# Patient Record
Sex: Female | Born: 1995 | Race: Black or African American | Hispanic: No | Marital: Single | Smoking: Current every day smoker
Health system: Southern US, Community
[De-identification: ages and names within clinical notes are randomized; demographics above are authoritative.]

## PROBLEM LIST (undated history)

## (undated) ENCOUNTER — Inpatient Hospital Stay: Payer: Self-pay

## (undated) DIAGNOSIS — F32A Depression, unspecified: Secondary | ICD-10-CM

## (undated) DIAGNOSIS — F4310 Post-traumatic stress disorder, unspecified: Secondary | ICD-10-CM

## (undated) DIAGNOSIS — F329 Major depressive disorder, single episode, unspecified: Secondary | ICD-10-CM

## (undated) HISTORY — PX: WISDOM TOOTH EXTRACTION: SHX21

---
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
## 2013-01-28 ENCOUNTER — Ambulatory Visit: Payer: Self-pay | Admitting: Primary Care

## 2013-04-02 ENCOUNTER — Emergency Department: Payer: Self-pay | Admitting: Internal Medicine

## 2013-10-30 ENCOUNTER — Emergency Department: Payer: Self-pay | Admitting: Internal Medicine

## 2013-10-30 LAB — URINALYSIS, COMPLETE
Bilirubin,UR: NEGATIVE
Glucose,UR: NEGATIVE mg/dL (ref 0–75)
Nitrite: NEGATIVE
PH: 6 (ref 4.5–8.0)
Protein: 100
RBC,UR: 284 /HPF (ref 0–5)
Specific Gravity: 1.025 (ref 1.003–1.030)
Squamous Epithelial: 29
WBC UR: 145 /HPF (ref 0–5)

## 2013-12-08 ENCOUNTER — Emergency Department: Payer: Self-pay | Admitting: Emergency Medicine

## 2013-12-08 LAB — COMPREHENSIVE METABOLIC PANEL
ALBUMIN: 4.2 g/dL (ref 3.4–5.0)
ANION GAP: 5 — AB (ref 7–16)
Alkaline Phosphatase: 78 U/L
BILIRUBIN TOTAL: 0.4 mg/dL (ref 0.2–1.0)
BUN: 16 mg/dL (ref 7–18)
CHLORIDE: 106 mmol/L (ref 98–107)
Calcium, Total: 9.4 mg/dL (ref 8.5–10.1)
Co2: 27 mmol/L (ref 21–32)
Creatinine: 0.83 mg/dL (ref 0.60–1.30)
Glucose: 79 mg/dL (ref 65–99)
Osmolality: 276 (ref 275–301)
Potassium: 3.6 mmol/L (ref 3.5–5.1)
SGOT(AST): 20 U/L (ref 15–37)
SGPT (ALT): 26 U/L (ref 12–78)
Sodium: 138 mmol/L (ref 136–145)
TOTAL PROTEIN: 8.3 g/dL — AB (ref 6.4–8.2)

## 2013-12-08 LAB — CBC WITH DIFFERENTIAL/PLATELET
BASOS PCT: 0.7 %
Basophil #: 0 10*3/uL (ref 0.0–0.1)
EOS ABS: 0 10*3/uL (ref 0.0–0.7)
Eosinophil %: 0.6 %
HCT: 42.3 % (ref 35.0–47.0)
HGB: 14 g/dL (ref 12.0–16.0)
LYMPHS ABS: 2.3 10*3/uL (ref 1.0–3.6)
Lymphocyte %: 52.9 %
MCH: 31.6 pg (ref 26.0–34.0)
MCHC: 33.1 g/dL (ref 32.0–36.0)
MCV: 95 fL (ref 80–100)
MONO ABS: 0.3 x10 3/mm (ref 0.2–0.9)
Monocyte %: 7.2 %
Neutrophil #: 1.7 10*3/uL (ref 1.4–6.5)
Neutrophil %: 38.6 %
Platelet: 144 10*3/uL — ABNORMAL LOW (ref 150–440)
RBC: 4.43 10*6/uL (ref 3.80–5.20)
RDW: 12.8 % (ref 11.5–14.5)
WBC: 4.3 10*3/uL (ref 3.6–11.0)

## 2013-12-08 LAB — URINALYSIS, COMPLETE
BILIRUBIN, UR: NEGATIVE
Bacteria: NONE SEEN
Blood: NEGATIVE
Glucose,UR: NEGATIVE mg/dL (ref 0–75)
Ketone: NEGATIVE
Leukocyte Esterase: NEGATIVE
NITRITE: NEGATIVE
PH: 6 (ref 4.5–8.0)
Protein: NEGATIVE
RBC,UR: <1 /HPF (ref 0–5)
Specific Gravity: 1.025 (ref 1.003–1.030)
Squamous Epithelial: 1
WBC UR: <1 /HPF (ref 0–5)

## 2013-12-08 LAB — LIPASE, BLOOD: LIPASE: 149 U/L (ref 73–393)

## 2013-12-08 LAB — TROPONIN I

## 2014-04-29 ENCOUNTER — Emergency Department: Payer: Self-pay | Admitting: Emergency Medicine

## 2014-04-29 LAB — URINALYSIS, COMPLETE
BLOOD: NEGATIVE
Bilirubin,UR: NEGATIVE
GLUCOSE, UR: NEGATIVE mg/dL (ref 0–75)
Ketone: NEGATIVE
LEUKOCYTE ESTERASE: NEGATIVE
NITRITE: NEGATIVE
PH: 6 (ref 4.5–8.0)
PROTEIN: NEGATIVE
RBC,UR: <1 /HPF (ref 0–5)
Specific Gravity: 1.017 (ref 1.003–1.030)
WBC UR: 1 /HPF (ref 0–5)

## 2014-07-30 ENCOUNTER — Emergency Department: Payer: Self-pay | Admitting: Emergency Medicine

## 2014-07-30 LAB — URINALYSIS, COMPLETE
BACTERIA: NONE SEEN
BILIRUBIN, UR: NEGATIVE
Blood: NEGATIVE
GLUCOSE, UR: NEGATIVE mg/dL (ref 0–75)
Leukocyte Esterase: NEGATIVE
NITRITE: NEGATIVE
PH: 6 (ref 4.5–8.0)
PROTEIN: NEGATIVE
RBC,UR: <1 /HPF (ref 0–5)
Specific Gravity: 1.02 (ref 1.003–1.030)
WBC UR: <1 /HPF (ref 0–5)

## 2014-07-30 LAB — COMPREHENSIVE METABOLIC PANEL
ALT: 27 U/L
ANION GAP: 8 (ref 7–16)
Albumin: 4.4 g/dL (ref 3.4–5.0)
Alkaline Phosphatase: 77 U/L
BILIRUBIN TOTAL: 0.9 mg/dL (ref 0.2–1.0)
BUN: 12 mg/dL (ref 7–18)
CO2: 25 mmol/L (ref 21–32)
Calcium, Total: 9.4 mg/dL (ref 8.5–10.1)
Chloride: 106 mmol/L (ref 98–107)
Creatinine: 0.71 mg/dL (ref 0.60–1.30)
EGFR (Non-African Amer.): >60
Glucose: 77 mg/dL (ref 65–99)
Osmolality: 276 (ref 275–301)
Potassium: 3.9 mmol/L (ref 3.5–5.1)
SGOT(AST): 31 U/L (ref 15–37)
SODIUM: 139 mmol/L (ref 136–145)
Total Protein: 8.6 g/dL — ABNORMAL HIGH (ref 6.4–8.2)

## 2014-07-30 LAB — CBC WITH DIFFERENTIAL/PLATELET
Eosinophil: 1 %
HCT: 43.2 % (ref 35.0–47.0)
HGB: 14.4 g/dL (ref 12.0–16.0)
Lymphocytes: 60 %
MCH: 31.8 pg (ref 26.0–34.0)
MCHC: 33.3 g/dL (ref 32.0–36.0)
MCV: 95 fL (ref 80–100)
MONOS PCT: 11 %
Platelet: 170 10*3/uL (ref 150–440)
RBC: 4.52 10*6/uL (ref 3.80–5.20)
RDW: 12.4 % (ref 11.5–14.5)
Segmented Neutrophils: 25 %
Variant Lymphocyte - H1-Rlymph: 3 %
WBC: 4.8 10*3/uL (ref 3.6–11.0)

## 2014-07-30 LAB — WET PREP, GENITAL

## 2014-07-31 LAB — GC/CHLAMYDIA PROBE AMP

## 2014-08-05 ENCOUNTER — Emergency Department: Payer: Self-pay | Admitting: Emergency Medicine

## 2014-08-05 LAB — CBC WITH DIFFERENTIAL/PLATELET
Basophil #: 0 10*3/uL (ref 0.0–0.1)
Basophil %: 0.8 %
EOS ABS: 0.2 10*3/uL (ref 0.0–0.7)
EOS PCT: 5.6 %
HCT: 40.6 % (ref 35.0–47.0)
HGB: 13.4 g/dL (ref 12.0–16.0)
LYMPHS ABS: 2.1 10*3/uL (ref 1.0–3.6)
LYMPHS PCT: 67.7 %
MCH: 31.8 pg (ref 26.0–34.0)
MCHC: 32.9 g/dL (ref 32.0–36.0)
MCV: 97 fL (ref 80–100)
MONO ABS: 0.3 x10 3/mm (ref 0.2–0.9)
MONOS PCT: 8.4 %
Neutrophil #: 0.5 10*3/uL — ABNORMAL LOW (ref 1.4–6.5)
Neutrophil %: 17.5 %
Platelet: 147 10*3/uL — ABNORMAL LOW (ref 150–440)
RBC: 4.2 10*6/uL (ref 3.80–5.20)
RDW: 12.6 % (ref 11.5–14.5)
WBC: 3.1 10*3/uL — ABNORMAL LOW (ref 3.6–11.0)

## 2014-08-05 LAB — URINALYSIS, COMPLETE
Bacteria: NONE SEEN
Bilirubin,UR: NEGATIVE
Blood: NEGATIVE
GLUCOSE, UR: NEGATIVE mg/dL (ref 0–75)
KETONE: NEGATIVE
Leukocyte Esterase: NEGATIVE
NITRITE: NEGATIVE
Ph: 6 (ref 4.5–8.0)
Protein: NEGATIVE
Specific Gravity: 1.02 (ref 1.003–1.030)
Squamous Epithelial: <1

## 2014-08-05 LAB — COMPREHENSIVE METABOLIC PANEL
ALK PHOS: 81 U/L
Albumin: 3.8 g/dL (ref 3.4–5.0)
Anion Gap: 5 — ABNORMAL LOW (ref 7–16)
BUN: 11 mg/dL (ref 7–18)
Bilirubin,Total: 0.5 mg/dL (ref 0.2–1.0)
Calcium, Total: 8.8 mg/dL (ref 8.5–10.1)
Chloride: 110 mmol/L — ABNORMAL HIGH (ref 98–107)
Co2: 25 mmol/L (ref 21–32)
Creatinine: 0.78 mg/dL (ref 0.60–1.30)
EGFR (African American): >60
EGFR (Non-African Amer.): >60
Glucose: 77 mg/dL (ref 65–99)
Osmolality: 278 (ref 275–301)
Potassium: 4.3 mmol/L (ref 3.5–5.1)
SGOT(AST): 26 U/L (ref 15–37)
SGPT (ALT): 20 U/L
SODIUM: 140 mmol/L (ref 136–145)
TOTAL PROTEIN: 7.6 g/dL (ref 6.4–8.2)

## 2014-08-05 LAB — LIPASE, BLOOD: Lipase: 143 U/L (ref 73–393)

## 2014-08-20 ENCOUNTER — Ambulatory Visit: Payer: Self-pay | Admitting: Primary Care

## 2014-12-22 ENCOUNTER — Emergency Department
Admission: EM | Admit: 2014-12-22 | Discharge: 2014-12-22 | Disposition: A | Discharge status: Home / Self Care | Payer: Self-pay | Attending: Emergency Medicine | Admitting: Emergency Medicine

## 2014-12-22 ENCOUNTER — Encounter: Payer: Self-pay | Admitting: Emergency Medicine

## 2014-12-22 DIAGNOSIS — Z3202 Encounter for pregnancy test, result negative: Secondary | ICD-10-CM | POA: Insufficient documentation

## 2014-12-22 DIAGNOSIS — R110 Nausea: Secondary | ICD-10-CM | POA: Insufficient documentation

## 2014-12-22 DIAGNOSIS — N760 Acute vaginitis: Secondary | ICD-10-CM | POA: Insufficient documentation

## 2014-12-22 DIAGNOSIS — Z720 Tobacco use: Secondary | ICD-10-CM | POA: Insufficient documentation

## 2014-12-22 LAB — CBC WITH DIFFERENTIAL/PLATELET
BASOS ABS: 0 10*3/uL (ref 0–0.1)
Basophils Relative: 1 %
EOS ABS: 0.1 10*3/uL (ref 0–0.7)
Eosinophils Relative: 1 %
HEMATOCRIT: 38.8 % (ref 35.0–47.0)
HEMOGLOBIN: 13.2 g/dL (ref 12.0–16.0)
Lymphocytes Relative: 48 %
Lymphs Abs: 2.1 10*3/uL (ref 1.0–3.6)
MCH: 32.6 pg (ref 26.0–34.0)
MCHC: 34 g/dL (ref 32.0–36.0)
MCV: 95.8 fL (ref 80.0–100.0)
MONO ABS: 0.3 10*3/uL (ref 0.2–0.9)
MONOS PCT: 7 %
NEUTROS PCT: 43 %
Neutro Abs: 1.8 10*3/uL (ref 1.4–6.5)
Platelets: 140 10*3/uL — ABNORMAL LOW (ref 150–440)
RBC: 4.05 MIL/uL (ref 3.80–5.20)
RDW: 12.6 % (ref 11.5–14.5)
WBC: 4.3 10*3/uL (ref 3.6–11.0)

## 2014-12-22 LAB — URINALYSIS COMPLETE WITH MICROSCOPIC (ARMC ONLY)
BILIRUBIN URINE: NEGATIVE
Glucose, UA: NEGATIVE mg/dL
KETONES UR: NEGATIVE mg/dL
Leukocytes, UA: NEGATIVE
Nitrite: NEGATIVE
PROTEIN: NEGATIVE mg/dL
Specific Gravity, Urine: 1.005 (ref 1.005–1.030)
pH: 7 (ref 5.0–8.0)

## 2014-12-22 LAB — CHLAMYDIA/NGC RT PCR (ARMC ONLY)
Chlamydia Tr: NOT DETECTED
N GONORRHOEAE: NOT DETECTED

## 2014-12-22 LAB — WET PREP, GENITAL
Trich, Wet Prep: NONE SEEN
Yeast Wet Prep HPF POC: NONE SEEN

## 2014-12-22 LAB — POCT PREGNANCY, URINE: PREG TEST UR: NEGATIVE

## 2014-12-22 MED ORDER — METRONIDAZOLE 500 MG PO TABS: 500.0000 mg | ORAL_TABLET | Freq: Two times a day (BID) | ORAL | Status: AC

## 2014-12-22 MED ORDER — IBUPROFEN 800 MG PO TABS: 800.0000 mg | ORAL_TABLET | Freq: Three times a day (TID) | ORAL | Status: DC | PRN

## 2014-12-22 NOTE — ED Notes (Signed)
States this morning woke up with body aches, states was unable to get out of bed, last week, episode of vaginal bleeding, lasted short time, light, then had white, clear discharge, continues with discharge and vaginal itching

## 2014-12-22 NOTE — Discharge Instructions (Signed)
Bacterial Vaginosis Bacterial vaginosis is a vaginal infection that occurs when the normal balance of bacteria in the vagina is disrupted. It results from an overgrowth of certain bacteria. This is the most common vaginal infection in women of childbearing age. Treatment is important to prevent complications, especially in pregnant women, as it can cause a premature delivery. CAUSES  Bacterial vaginosis is caused by an increase in harmful bacteria that are normally present in smaller amounts in the vagina. Several different kinds of bacteria can cause bacterial vaginosis. However, the reason that the condition develops is not fully understood. RISK FACTORS Certain activities or behaviors can put you at an increased risk of developing bacterial vaginosis, including:  Having a new sex partner or multiple sex partners.  Douching.  Using an intrauterine device (IUD) for contraception. Women do not get bacterial vaginosis from toilet seats, bedding, swimming pools, or contact with objects around them. SIGNS AND SYMPTOMS  Some women with bacterial vaginosis have no signs or symptoms. Common symptoms include:  Grey vaginal discharge.  A fishlike odor with discharge, especially after sexual intercourse.  Itching or burning of the vagina and vulva.  Burning or pain with urination. DIAGNOSIS  Your health care provider will take a medical history and examine the vagina for signs of bacterial vaginosis. A sample of vaginal fluid may be taken. Your health care provider will look at this sample under a microscope to check for bacteria and abnormal cells. A vaginal pH test may also be done.  TREATMENT  Bacterial vaginosis may be treated with antibiotic medicines. These may be given in the form of a pill or a vaginal cream. A second round of antibiotics may be prescribed if the condition comes back after treatment.  HOME CARE INSTRUCTIONS   Only take over-the-counter or prescription medicines as  directed by your health care provider.  If antibiotic medicine was prescribed, take it as directed. Make sure you finish it even if you start to feel better.  Do not have sex until treatment is completed.  Tell all sexual partners that you have a vaginal infection. They should see their health care provider and be treated if they have problems, such as a mild rash or itching.  Practice safe sex by using condoms and only having one sex partner. SEEK MEDICAL CARE IF:   Your symptoms are not improving after 3 days of treatment.  You have increased discharge or pain.  You have a fever. MAKE SURE YOU:   Understand these instructions.  Will watch your condition.  Will get help right away if you are not doing well or get worse. FOR MORE INFORMATION  Centers for Disease Control and Prevention, Division of STD Prevention: www.cdc.gov/std American Sexual Health Association (ASHA): www.ashastd.org  Document Released: 07/31/2005 Document Revised: 05/21/2013 Document Reviewed: 03/12/2013 Exit[employer] Patient Information 2015 [employer]. This information is not intended to replace advice given to you by your health care provider. Make sure you discuss any questions you have with your health care provider.  

## 2014-12-22 NOTE — ED Provider Notes (Signed)
[facility] Emergency Department Provider Note  ____________________________________________  Time seen: Approximately 1555  I have reviewed the triage vital signs and the nursing notes.   HISTORY  Chief Complaint Generalized Body Aches and Nausea    HPI Kimberly Powers is a 19 y.o. female complaining of a couple day history ofvaginal discharge and itching spotting since she woke up today felt a little tired and weak is concerned that she may be pregnant is here today for evaluation denies any pain has had some intermittent nausea nothing seems to making anything better or worse no other associated signs or symptoms except for an previously noted   History reviewed. No pertinent past medical history.  There are no active problems to display for this patient.   History reviewed. No pertinent past surgical history.  Current Outpatient Rx  Name  Route  Sig  Dispense  Refill  . ibuprofen (ADVIL,MOTRIN) 800 MG tablet   Oral   Take 1 tablet (800 mg total) by mouth every 8 (eight) hours as needed.   30 tablet   0   . metroNIDAZOLE (FLAGYL) 500 MG tablet   Oral   Take 1 tablet (500 mg total) by mouth 2 (two) times daily.   14 tablet   0     Allergies Review of patient's allergies indicates no known allergies.  No family history on file.  Social History History  Substance Use Topics  . Smoking status: Current Every Day Smoker  . Smokeless tobacco: Not on file  . Alcohol Use: Yes     Comment: beer/day    Review of Systems Constitutional: No fever/chills Eyes: No visual changes. ENT: No sore throat. Cardiovascular: Denies chest pain. Respiratory: Denies shortness of breath. Gastrointestinal: No abdominal pain.  No nausea, no vomiting.  No diarrhea.  No constipation. Genitourinary: Negative for dysuria. Musculoskeletal: Negative for back pain. Skin: Negative for rash. Neurological: Negative for headaches, focal weakness or  numbness.  6point ROS otherwise negative.  ____________________________________________   PHYSICAL EXAM:  VITAL SIGNS: ED Triage Vitals  Enc Vitals Group     BP 12/22/14 1448 118/76 mmHg     Pulse Rate 12/22/14 1448 74     Resp --      Temp 12/22/14 1448 98.2 F (36.8 C)     Temp Source 12/22/14 1448 Oral     SpO2 12/22/14 1448 100 %     Weight 12/22/14 1448 105 lb (47.628 kg)     Height 12/22/14 1448 5\' 3"  (1.6 m)     Head Cir --      Peak Flow --      Pain Score 12/22/14 1448 10     Pain Loc --      Pain Edu? --      Excl. in GC? --     Constitutional: Alert and oriented. Well appearing and in no acute distress. Eyes: Conjunctivae are normal. PERRL. EOMI. Head: Atraumatic. Cardiovascular: Normal rate, regular rhythm. Grossly normal heart sounds.  Good peripheral circulation. Respiratory: Normal respiratory effort.  No retractions. Lungs CTAB. Gastrointestinal: Soft and nontender. No distention. No abdominal bruits. No CVA tenderness. Genitourinary: External genitalia unremarkable bimanual exam revealed no cervical or adnexal tenderness patient has a grayish white vaginal discharge Muloskeletal: No lower extremity tenderness nor edema.  No joint effusions. Neurologic:  Normal speech and language. No gross focal neurologic deficits are appreciated. Speech is normal. No gait instability. Skin:  Skin is warm, dry and intact. No rash noted. Psychiatric: Mood  and affect are normal. Speech and behavior are normal.  ____________________________________________   LABS (all labs ordered are listed, but only abnormal results are displayed)  Labs Reviewed  WET PREP, GENITAL - Abnormal; Notable for the following:    Clue Cells Wet Prep HPF POC FEW (*)    WBC, Wet Prep HPF POC FEW (*)    All other components within normal limits  CBC WITH DIFFERENTIAL/PLATELET - Abnormal; Notable for the following:    Platelets 140 (*)    All other components within normal limits  URINALYSIS  COMPLETEWITH MICROSCOPIC ([facility])  - Abnormal; Notable for the following:    Color, Urine COLORLESS (*)    APPearance CLEAR (*)    Hgb urine dipstick 1+ (*)    Bacteria, UA RARE (*)    Squamous Epithelial / LPF 0-5 (*)    All other components within normal limits  CHLAMYDIA/NGC RT PCR ([facility])   POC URINE PREG, ED  POCT PREGNANCY, URINE   ____________________________________________   PROCEDURES  Procedure(s) performed: None  Critical Care performed: No  ____________________________________________   INITIAL IMPRESSION / ASSESSMENT AND PLAN / ED COURSE  Pertinent labs & imaging results that were available during my care of the patient were reviewed by me and considered in my medical decision making (see chart for details).  Bacterial vaginitis patient be discharged home prescriptions for Flagyl Motrin follow-up with OB/GYN as needed for any further concerns or return here for any acute concerns or worsening symptoms ____________________________________________   FINAL CLINICAL IMPRESSION(S) / ED DIAGNOSES  Final diagnoses:  Vaginitis      Jamicah Anstead Rosalyn GessWilliam C Izzak Fries, PA-C 12/22/14 1807  Sharyn CreamerMark Quale, MD 12/26/14 1657

## 2015-02-03 ENCOUNTER — Emergency Department
Admission: EM | Admit: 2015-02-03 | Discharge: 2015-02-03 | Disposition: A | Discharge status: Home / Self Care | Payer: Self-pay | Attending: Emergency Medicine | Admitting: Emergency Medicine

## 2015-02-03 DIAGNOSIS — Z720 Tobacco use: Secondary | ICD-10-CM | POA: Insufficient documentation

## 2015-02-03 DIAGNOSIS — N760 Acute vaginitis: Secondary | ICD-10-CM | POA: Insufficient documentation

## 2015-02-03 DIAGNOSIS — Z3202 Encounter for pregnancy test, result negative: Secondary | ICD-10-CM | POA: Insufficient documentation

## 2015-02-03 DIAGNOSIS — B9689 Other specified bacterial agents as the cause of diseases classified elsewhere: Secondary | ICD-10-CM

## 2015-02-03 LAB — URINALYSIS COMPLETE WITH MICROSCOPIC (ARMC ONLY)
BILIRUBIN URINE: NEGATIVE
GLUCOSE, UA: NEGATIVE mg/dL
Hgb urine dipstick: NEGATIVE
Ketones, ur: NEGATIVE mg/dL
Leukocytes, UA: NEGATIVE
Nitrite: NEGATIVE
PH: 6 (ref 5.0–8.0)
Protein, ur: NEGATIVE mg/dL
Specific Gravity, Urine: 1.023 (ref 1.005–1.030)

## 2015-02-03 LAB — POCT PREGNANCY, URINE: Preg Test, Ur: NEGATIVE

## 2015-02-03 LAB — WET PREP, GENITAL
CLUE CELLS WET PREP: NONE SEEN
TRICH WET PREP: NONE SEEN
Yeast Wet Prep HPF POC: NONE SEEN

## 2015-02-03 LAB — CHLAMYDIA/NGC RT PCR (ARMC ONLY)
Chlamydia Tr: DETECTED
N GONORRHOEAE: NOT DETECTED

## 2015-02-03 MED ORDER — METRONIDAZOLE 500 MG PO TABS: 500.0000 mg | ORAL_TABLET | Freq: Two times a day (BID) | ORAL | Status: DC

## 2015-02-03 NOTE — ED Notes (Signed)
Patient presents to the ED stating, "I think I have an STD".  Patient reports having unprotected sex over the weekend and she states she believes her sexual partner to have an STD.  Patient reports thick white discharge since yesterday and tender breasts.  Patient reports vaginal bleeding last week.  Patient states she is late getting her depo-provera shot.

## 2015-02-03 NOTE — ED Provider Notes (Signed)
[facility] Emergency Department Provider Note  ____________________________________________  Time seen:  12:29 PM  I have reviewed the triage vital signs and the nursing notes.   HISTORY  Chief Complaint Exposure to STD    HPI Kimberly Powers is a 19 y.o. female thinks that she has "STD". Patient states she had unprotected sex over the weekend and believes she was exposed 1. She is not sure what and has not contacted her sexual partner. She reports a thick white discharge since yesterday. She also was approximately one week late getting her Depo-Provera shot. She complains of her breast being tender. Currently she rates her pain as 0/10. She states there is some mild itching and 2 nights ago use some over-the-counter yeast cream which did not seem to help.   No past medical history on file.  There are no active problems to display for this patient.   No past surgical history on file.  Current Outpatient Rx  Name  Route  Sig  Dispense  Refill  . ibuprofen (ADVIL,MOTRIN) 800 MG tablet   Oral   Take 1 tablet (800 mg total) by mouth every 8 (eight) hours as needed.   30 tablet   0   . metroNIDAZOLE (FLAGYL) 500 MG tablet   Oral   Take 1 tablet (500 mg total) by mouth 2 (two) times daily.   14 tablet   0     Allergies Review of patient's allergies indicates no known allergies.  No family history on file.  Social History History  Substance Use Topics  . Smoking status: Current Every Day Smoker  . Smokeless tobacco: Not on file  . Alcohol Use: Yes     Comment: beer/day    Review of Systems Constitutional: No fever/chills Eyes: No visual changes. ENT: No sore throat. Cardiovascular: Denies chest pain. Respiratory: Denies shortness of breath. Gastrointestinal: No abdominal pain.  No nausea, no vomiting. Genitourinary: Negative for dysuria. Musculoskeletal: Negative for back pain. Skin: Negative for rash. Neurological: Negative for  headaches, focal weakness or numbness.  10-point ROS otherwise negative.  ____________________________________________   PHYSICAL EXAM:  VITAL SIGNS: ED Triage Vitals  Enc Vitals Group     BP 02/03/15 1151 110/76 mmHg     Pulse Rate 02/03/15 1151 85     Resp 02/03/15 1151 20     Temp 02/03/15 1151 98.1 F (36.7 C)     Temp Source 02/03/15 1151 Oral     SpO2 02/03/15 1151 100 %     Weight 02/03/15 1151 105 lb (47.628 kg)     Height 02/03/15 1151  (1.6 m)     Head Cir --      Peak Flow --      Pain Score 02/03/15 1152 0     Pain Loc --      Pain Edu? --      Excl. in GC? --     Constitutional: Alert and oriented. Well appearing and in no acute distress. Eyes: Conjunctivae are normal. PERRL. EOMI. Head: Atraumatic. Nose: No congestion/rhinnorhea. Neck: No stridor.   Cardiovascular: Normal rate, regular rhythm. Grossly normal heart sounds.  Good peripheral circulation. Respiratory: Normal respiratory effort.  No retractions. Lungs CTAB. PELVIC:  Moderate amount of white discharge noted exteriorly and internally. There is no cervical motion tenderness, no masses or tenderness adnexal areas. Cervix does not appear to be inflamed. Gastrointestinal: Soft and nontender. No distention. No abdominal bruits. No CVA tenderness. Musculoskeletal: No lower extremity tenderness nor  edema.  No joint effusions. Neurologic:  Normal speech and language. No gross focal neurologic deficits are appreciated. Speech is normal. No gait instability. Skin:  Skin is warm, dry and intact. No rash noted. Psychiatric: Mood and affect are normal. Speech and behavior are normal.  ____________________________________________   LABS (all labs ordered are listed, but only abnormal results are displayed)  Labs Reviewed  WET PREP, GENITAL - Abnormal; Notable for the following:    WBC, Wet Prep HPF POC MODERATE (*)    All other components within normal limits  URINALYSIS COMPLETEWITH MICROSCOPIC  ([facility] ONLY) - Abnormal; Notable for the following:    Color, Urine YELLOW (*)    APPearance HAZY (*)    Bacteria, UA MANY (*)    Squamous Epithelial / LPF 6-30 (*)    All other components within normal limits  CHLAMYDIA/NGC RT PCR ([facility] ONLY)  POC URINE PREG, ED  POCT PREGNANCY, URINE    PROCEDURES  Procedure(s) performed: None  Critical Care performed: No  ____________________________________________   INITIAL IMPRESSION / ASSESSMENT AND PLAN / ED COURSE  Pertinent labs & imaging results that were available during my care of the patient were reviewed by me and considered in my medical decision making (see chart for details).  Patient was told that she has a bacterial vaginitis with positive clue cells on wet prep. She was started on Flagyl for 7 days. She is to follow-up with health department if any continued problems. She was also told that if her GC chlamydia test is positive she will be notified by phone. ____________________________________________   FINAL CLINICAL IMPRESSION(S) / ED DIAGNOSES  Final diagnoses:  Bacterial vaginal infection      Tommi Rumps, PA-C 02/03/15 1443  Governor Rooks, MD 02/03/15 1547

## 2015-02-03 NOTE — Discharge Instructions (Signed)
Bacterial Vaginosis Bacterial vaginosis is a vaginal infection that occurs when the normal balance of bacteria in the vagina is disrupted. It results from an overgrowth of certain bacteria. This is the most common vaginal infection in women of childbearing age. Treatment is important to prevent complications, especially in pregnant women, as it can cause a premature delivery. CAUSES  Bacterial vaginosis is caused by an increase in harmful bacteria that are normally present in smaller amounts in the vagina. Several different kinds of bacteria can cause bacterial vaginosis. However, the reason that the condition develops is not fully understood. RISK FACTORS Certain activities or behaviors can put you at an increased risk of developing bacterial vaginosis, including:  Having a new sex partner or multiple sex partners.  Douching.  Using an intrauterine device (IUD) for contraception. Women do not get bacterial vaginosis from toilet seats, bedding, swimming pools, or contact with objects around them. SIGNS AND SYMPTOMS  Some women with bacterial vaginosis have no signs or symptoms. Common symptoms include:  Grey vaginal discharge.  A fishlike odor with discharge, especially after sexual intercourse.  Itching or burning of the vagina and vulva.  Burning or pain with urination. DIAGNOSIS  Your health care provider will take a medical history and examine the vagina for signs of bacterial vaginosis. A sample of vaginal fluid may be taken. Your health care provider will look at this sample under a microscope to check for bacteria and abnormal cells. A vaginal pH test may also be done.  TREATMENT  Bacterial vaginosis may be treated with antibiotic medicines. These may be given in the form of a pill or a vaginal cream. A second round of antibiotics may be prescribed if the condition comes back after treatment.  HOME CARE INSTRUCTIONS   Only take over-the-counter or prescription medicines as  directed by your health care provider.  If antibiotic medicine was prescribed, take it as directed. Make sure you finish it even if you start to feel better.  Do not have sex until treatment is completed.  Tell all sexual partners that you have a vaginal infection. They should see their health care provider and be treated if they have problems, such as a mild rash or itching.  Practice safe sex by using condoms and only having one sex partner. SEEK MEDICAL CARE IF:   Your symptoms are not improving after 3 days of treatment.  You have increased discharge or pain.  You have a fever. MAKE SURE YOU:   Understand these instructions.  Will watch your condition.  Will get help right away if you are not doing well or get worse. FOR MORE INFORMATION  Centers for Disease Control and Prevention, Division of STD Prevention: www.cdc.gov/std American Sexual Health Association (ASHA): www.ashastd.org  Document Released: 07/31/2005 Document Revised: 05/21/2013 Document Reviewed: 03/12/2013 Exit[employer] Patient Information 2015 [employer]. This information is not intended to replace advice given to you by your health care provider. Make sure you discuss any questions you have with your health care provider.  

## 2015-03-23 ENCOUNTER — Encounter: Payer: Self-pay | Admitting: Emergency Medicine

## 2015-03-23 ENCOUNTER — Other Ambulatory Visit: Payer: Self-pay

## 2015-03-23 ENCOUNTER — Emergency Department
Admission: EM | Admit: 2015-03-23 | Discharge: 2015-03-23 | Disposition: A | Discharge status: Home / Self Care | Payer: Self-pay | Attending: Emergency Medicine | Admitting: Emergency Medicine

## 2015-03-23 DIAGNOSIS — Z792 Long term (current) use of antibiotics: Secondary | ICD-10-CM | POA: Insufficient documentation

## 2015-03-23 DIAGNOSIS — K2100 Gastro-esophageal reflux disease with esophagitis, without bleeding: Secondary | ICD-10-CM

## 2015-03-23 DIAGNOSIS — Z720 Tobacco use: Secondary | ICD-10-CM | POA: Insufficient documentation

## 2015-03-23 DIAGNOSIS — R079 Chest pain, unspecified: Secondary | ICD-10-CM | POA: Insufficient documentation

## 2015-03-23 DIAGNOSIS — K219 Gastro-esophageal reflux disease without esophagitis: Secondary | ICD-10-CM | POA: Insufficient documentation

## 2015-03-23 MED ORDER — RANITIDINE HCL 150 MG PO CAPS: 150.0000 mg | ORAL_CAPSULE | Freq: Two times a day (BID) | ORAL | Status: DC

## 2015-03-23 MED ORDER — GI COCKTAIL ~~LOC~~
30.0000 mL | ORAL | Status: AC
  Administered 2015-03-23: 30 mL via ORAL
  Filled 2015-03-23: qty 30

## 2015-03-23 MED ORDER — ONDANSETRON 8 MG PO TBDP
8.0000 mg | ORAL_TABLET | Freq: Once | ORAL | Status: AC
  Administered 2015-03-23: 8 mg via ORAL
  Filled 2015-03-23: qty 1

## 2015-03-23 MED ORDER — FAMOTIDINE 20 MG PO TABS
40.0000 mg | ORAL_TABLET | Freq: Once | ORAL | Status: AC
  Administered 2015-03-23: 40 mg via ORAL
  Filled 2015-03-23: qty 2

## 2015-03-23 MED ORDER — SUCRALFATE 1 G PO TABS: 1.0000 g | ORAL_TABLET | Freq: Four times a day (QID) | ORAL | Status: DC

## 2015-03-23 NOTE — ED Notes (Signed)
Pt to ed with c/o abd pain and chest pain that radiates to lower back x 2 years.  Pt states it is sharp and shooting and also achy.  Pt reports +smoker, +sob, +weakness, +dizziness.

## 2015-03-23 NOTE — ED Provider Notes (Signed)
[facility] Emergency Department Provider Note  ____________________________________________  Time seen: 7:40 AM  I have reviewed the triage vital signs and the nursing notes.   HISTORY  Chief Complaint Chest Pain and Abdominal Pain    HPI Kimberly Powers is a 19 y.o. female who complains of intermittent upper abdominal pain for the past 2 years. It seems to be worse with certain foods like fried foods, and less with bland foods like starches mashed potatoes. It bothers her particularly at night to the point where she has severe pain when she wakes up in the morning that seems to get worsened with movement. It radiates to her back and chest is sharp and intermittent and not pleuritic or exertional. No syncope. No vomiting or diarrhea. The pain also sometimes seems to be worsened by taking a deep breath which the patient interprets the shortness of breath.       History reviewed. No pertinent past medical history.  There are no active problems to display for this patient.   History reviewed. No pertinent past surgical history.  Current Outpatient Rx  Name  Route  Sig  Dispense  Refill  . ibuprofen (ADVIL,MOTRIN) 800 MG tablet   Oral   Take 1 tablet (800 mg total) by mouth every 8 (eight) hours as needed.   30 tablet   0   . metroNIDAZOLE (FLAGYL) 500 MG tablet   Oral   Take 1 tablet (500 mg total) by mouth 2 (two) times daily.   14 tablet   0   . ranitidine (ZANTAC) 150 MG capsule   Oral   Take 1 capsule (150 mg total) by mouth 2 (two) times daily.   28 capsule   0   . sucralfate (CARAFATE) 1 G tablet   Oral   Take 1 tablet (1 g total) by mouth 4 (four) times daily.   120 tablet   1     Allergies Review of patient's allergies indicates no known allergies.  History reviewed. No pertinent family history. Positive for biliary disease Social History History  Substance Use Topics  . Smoking status: Current Every Day Smoker  .  Smokeless tobacco: Not on file  . Alcohol Use: No     Comment: beer/day    Review of Systems  Constitutional: No fever or chills. No weight changes Eyes:No blurry vision or double vision.  ENT: No sore throat. Cardiovascular: Intermittent chest pain. Respiratory: No dyspnea or cough. Gastrointestinal: Positive upper abdominal pain as above. No vomiting and diarrhea.  No BRBPR or melena. Genitourinary: Negative for dysuria, urinary retention, bloody urine, or difficulty urinating. Musculoskeletal: Negative for back pain. No joint swelling or pain. Skin: Negative for rash. Neurological: Negative for headaches, focal weakness or numbness. Psychiatric:No anxiety or depression.   Endocrine:No hot/cold intolerance, changes in energy, or sleep difficulty.  10-point ROS otherwise negative.  ____________________________________________   PHYSICAL EXAM:  VITAL SIGNS: ED Triage Vitals  Enc Vitals Group     BP 03/23/15 0739 101/62 mmHg     Pulse Rate 03/23/15 0739 57     Resp 03/23/15 0739 20     Temp 03/23/15 0739 97.9 F (36.6 C)     Temp Source 03/23/15 0739 Oral     SpO2 03/23/15 0739 100 %     Weight 03/23/15 0735 110 lb (49.896 kg)     Height 03/23/15 0735  (1.6 m)     Head Cir --      Peak Flow --  Pain Score 03/23/15 0735 9     Pain Loc --      Pain Edu? --      Excl. in GC? --      Constitutional: Alert and oriented. Well appearing and in no distress. Eyes: No scleral icterus. No conjunctival pallor. PERRL. EOMI ENT   Head: Normocephalic and atraumatic.   Nose: No congestion/rhinnorhea. No septal hematoma   Mouth/Throat: MMM, no pharyngeal erythema. No peritonsillar mass. No uvula shift.   Neck: No stridor. No SubQ emphysema. No meningismus. Hematological/Lymphatic/Immunilogical: No cervical lymphadenopathy. Cardiovascular: RRR. Normal and symmetric distal pulses are present in all extremities. No murmurs, rubs, or gallops. Respiratory:  Normal respiratory effort without tachypnea nor retractions. Breath sounds are clear and equal bilaterally. No wheezes/rales/rhonchi. Gastrointestinal: Soft and nontender. No distention. There is no CVA tenderness.  No rebound, rigidity, or guarding. No bruit or pulsatile mass Genitourinary: deferred Musculoskeletal: Nontender with normal range of motion in all extremities. No joint effusions.  No lower extremity tenderness.  No edema. Neurologic:   Normal speech and language.  CN 2-10 normal. Motor grossly intact. No pronator drift.  Normal gait. No gross focal neurologic deficits are appreciated.  Skin:  Skin is warm, dry and intact. No rash noted.  No petechiae, purpura, or bullae. Psychiatric: Mood and affect are normal. Speech and behavior are normal. Patient exhibits appropriate insight and judgment.  ____________________________________________    LABS (pertinent positives/negatives) (all labs ordered are listed, but only abnormal results are displayed) Labs Reviewed - No data to display ____________________________________________   EKG  Interpreted by me  Date: 03/23/2015  Rate: 57  Rhythm: normal sinus rhythm  QRS Axis: normal  Intervals: normal  ST/T Wave abnormalities: normal  Conduction Disutrbances: none  Narrative Interpretation: unremarkable      ____________________________________________    RADIOLOGY    ____________________________________________   PROCEDURES  ____________________________________________   INITIAL IMPRESSION / ASSESSMENT AND PLAN / ED COURSE  Pertinent labs & imaging results that were available during my care of the patient were reviewed by me and considered in my medical decision making (see chart for details).  19 year old female presents with chronic intermittent upper abdominal pain. Low suspicion for ACS PE TAD pneumothorax carditis mediastinitis pneumonia or sepsis. Low suspicion for AAA cholecystitis cholangitis  pancreatitis obstruction or surgical pathology. She is well-appearing no acute distress. Her history is very consistent with acid reflux which is likely worsening by her diet and smoking. I counseled the patient on both of these including counseling on smoking cessation, and we will give her antacids to control her symptoms.  ____________________________________________   FINAL CLINICAL IMPRESSION(S) / ED DIAGNOSES  Final diagnoses:  Gastroesophageal reflux disease with esophagitis      Sharman Cheek, MD 03/23/15 [phone]

## 2015-03-23 NOTE — Discharge Instructions (Signed)
Food Choices for Gastroesophageal Reflux Disease When you have gastroesophageal reflux disease (GERD), the foods you eat and your eating habits are very important. Choosing the right foods can help ease the discomfort of GERD. WHAT GENERAL GUIDELINES DO I NEED TO FOLLOW?  Choose fruits, vegetables, whole grains, low-fat dairy products, and low-fat meat, fish, and poultry.  Limit fats such as oils, salad dressings, butter, nuts, and avocado.  Keep a food diary to identify foods that cause symptoms.  Avoid foods that cause reflux. These may be different for different people.  Eat frequent small meals instead of three large meals each day.  Eat your meals slowly, in a relaxed setting.  Limit fried foods.  Cook foods using methods other than frying.  Avoid drinking alcohol.  Avoid drinking large amounts of liquids with your meals.  Avoid bending over or lying down until 2-3 hours after eating. WHAT FOODS ARE NOT RECOMMENDED? The following are some foods and drinks that may worsen your symptoms: Vegetables Tomatoes. Tomato juice. Tomato and spaghetti sauce. Chili peppers. Onion and garlic. Horseradish. Fruits Oranges, grapefruit, and lemon (fruit and juice). Meats High-fat meats, fish, and poultry. This includes hot dogs, ribs, ham, sausage, salami, and bacon. Dairy Whole milk and chocolate milk. Sour cream. Cream. Butter. Ice cream. Cream cheese.  Beverages Coffee and tea, with or without caffeine. Carbonated beverages or energy drinks. Condiments Hot sauce. Barbecue sauce.  Sweets/Desserts Chocolate and cocoa. Donuts. Peppermint and spearmint. Fats and Oils High-fat foods, including French fries and potato chips. Other Vinegar. Strong spices, such as black pepper, white pepper, red pepper, cayenne, curry powder, cloves, ginger, and chili powder. The items listed above may not be a complete list of foods and beverages to avoid. Contact your dietitian for more  information. Document Released: 07/31/2005 Document Revised: 08/05/2013 Document Reviewed: 06/04/2013 ExitCare Patient Information 2015 [employer]. This information is not intended to replace advice given to you by your health care provider. Make sure you discuss any questions you have with your health care provider. Gastroesophageal Reflux Disease, Adult Gastroesophageal reflux disease (GERD) happens when acid from your stomach flows up into the esophagus. When acid comes in contact with the esophagus, the acid causes soreness (inflammation) in the esophagus. Over time, GERD may create small holes (ulcers) in the lining of the esophagus. CAUSES   Increased body weight. This puts pressure on the stomach, making acid rise from the stomach into the esophagus.  Smoking. This increases acid production in the stomach.  Drinking alcohol. This causes decreased pressure in the lower esophageal sphincter (valve or ring of muscle between the esophagus and stomach), allowing acid from the stomach into the esophagus.  Late evening meals and a full stomach. This increases pressure and acid production in the stomach.  A malformed lower esophageal sphincter. Sometimes, no cause is found. SYMPTOMS   Burning pain in the lower part of the mid-chest behind the breastbone and in the mid-stomach area. This may occur twice a week or more often.  Trouble swallowing.  Sore throat.  Dry cough.  Asthma-like symptoms including chest tightness, shortness of breath, or wheezing. DIAGNOSIS  Your caregiver may be able to diagnose GERD based on your symptoms. In some cases, X-rays and other tests may be done to check for complications or to check the condition of your stomach and esophagus. TREATMENT  Your caregiver may recommend over-the-counter or prescription medicines to help decrease acid production. Ask your caregiver before starting or adding any new medicines.  HOME CARE   INSTRUCTIONS   Change the  factors that you can control. Ask your caregiver for guidance concerning weight loss, quitting smoking, and alcohol consumption.  Avoid foods and drinks that make your symptoms worse, such as:  Caffeine or alcoholic drinks.  Chocolate.  Peppermint or mint flavorings.  Garlic and onions.  Spicy foods.  Citrus fruits, such as oranges, lemons, or limes.  Tomato-based foods such as sauce, chili, salsa, and pizza.  Fried and fatty foods.  Avoid lying down for the 3 hours prior to your bedtime or prior to taking a nap.  Eat small, frequent meals instead of large meals.  Wear loose-fitting clothing. Do not wear anything tight around your waist that causes pressure on your stomach.  Raise the head of your bed 6 to 8 inches with wood blocks to help you sleep. Extra pillows will not help.  Only take over-the-counter or prescription medicines for pain, discomfort, or fever as directed by your caregiver.  Do not take aspirin, ibuprofen, or other nonsteroidal anti-inflammatory drugs (NSAIDs). SEEK IMMEDIATE MEDICAL CARE IF:   You have pain in your arms, neck, jaw, teeth, or back.  Your pain increases or changes in intensity or duration.  You develop nausea, vomiting, or sweating (diaphoresis).  You develop shortness of breath, or you faint.  Your vomit is green, yellow, black, or looks like coffee grounds or blood.  Your stool is red, bloody, or black. These symptoms could be signs of other problems, such as heart disease, gastric bleeding, or esophageal bleeding. MAKE SURE YOU:   Understand these instructions.  Will watch your condition.  Will get help right away if you are not doing well or get worse. Document Released: 05/10/2005 Document Revised: 10/23/2011 Document Reviewed: 02/17/2011 ExitCare Patient Information 2015 [employer]. This information is not intended to replace advice given to you by your health care provider. Make sure you discuss any questions you have  with your health care provider.  

## 2015-05-19 ENCOUNTER — Encounter: Payer: Self-pay | Admitting: *Deleted

## 2015-05-19 ENCOUNTER — Emergency Department
Admission: EM | Admit: 2015-05-19 | Discharge: 2015-05-20 | Disposition: A | Discharge status: Home / Self Care | Payer: Self-pay | Attending: Emergency Medicine | Admitting: Emergency Medicine

## 2015-05-19 DIAGNOSIS — Z79899 Other long term (current) drug therapy: Secondary | ICD-10-CM | POA: Insufficient documentation

## 2015-05-19 DIAGNOSIS — Z792 Long term (current) use of antibiotics: Secondary | ICD-10-CM | POA: Insufficient documentation

## 2015-05-19 DIAGNOSIS — N898 Other specified noninflammatory disorders of vagina: Secondary | ICD-10-CM | POA: Insufficient documentation

## 2015-05-19 DIAGNOSIS — Z3202 Encounter for pregnancy test, result negative: Secondary | ICD-10-CM | POA: Insufficient documentation

## 2015-05-19 DIAGNOSIS — Z720 Tobacco use: Secondary | ICD-10-CM | POA: Insufficient documentation

## 2015-05-19 LAB — URINALYSIS COMPLETE WITH MICROSCOPIC (ARMC ONLY)
BACTERIA UA: NONE SEEN
Bilirubin Urine: NEGATIVE
Glucose, UA: NEGATIVE mg/dL
Ketones, ur: NEGATIVE mg/dL
LEUKOCYTES UA: NEGATIVE
Nitrite: NEGATIVE
PH: 6 (ref 5.0–8.0)
PROTEIN: NEGATIVE mg/dL
Specific Gravity, Urine: 1.003 — ABNORMAL LOW (ref 1.005–1.030)

## 2015-05-19 NOTE — ED Notes (Signed)
Urine POC negative. 

## 2015-05-19 NOTE — ED Notes (Signed)
Pt reports a white discharge, back pain, lower abdominal pressure for about a week and some vaginal bleeding that started today. Pt denies urinary symptoms.

## 2015-05-20 LAB — CHLAMYDIA/NGC RT PCR (ARMC ONLY)
Chlamydia Tr: NOT DETECTED
N gonorrhoeae: NOT DETECTED

## 2015-05-20 LAB — WET PREP, GENITAL
Clue Cells Wet Prep HPF POC: NONE SEEN
Trich, Wet Prep: NONE SEEN
YEAST WET PREP: NONE SEEN

## 2015-05-20 NOTE — Discharge Instructions (Signed)
Please seek medical attention for any high fevers, chest pain, shortness of breath, change in behavior, persistent vomiting, bloody stool or any other new or concerning symptoms.  

## 2015-05-20 NOTE — ED Provider Notes (Signed)
[facility] Emergency Department Provider Note   ____________________________________________  Time seen: 1240  I have reviewed the triage vital signs and the nursing notes.   HISTORY  Chief Complaint Vaginal Bleeding   History limited by: Not Limited   HPI Kimberly Powers is a 19 y.o. female who presents to the emergency department today with primary concern for abnormal vaginal discharge or bleeding. She states that for the past week to week and a half she has noticed a whitish discharge. She denies any foul odor. She states it has been accompanied by some itchiness. She denies having similar symptoms in the past. Patient denies any associated fevers. She has had some slight nausea recently. In addition the patient states she has been having issues with low back pain for a long time. She denies any numbness tingling or decrease in strength in her lower extremities.   History reviewed. No pertinent past medical history.  There are no active problems to display for this patient.   History reviewed. No pertinent past surgical history.  Current Outpatient Rx  Name  Route  Sig  Dispense  Refill  . ibuprofen (ADVIL,MOTRIN) 800 MG tablet   Oral   Take 1 tablet (800 mg total) by mouth every 8 (eight) hours as needed.   30 tablet   0   . metroNIDAZOLE (FLAGYL) 500 MG tablet   Oral   Take 1 tablet (500 mg total) by mouth 2 (two) times daily.   14 tablet   0   . ranitidine (ZANTAC) 150 MG capsule   Oral   Take 1 capsule (150 mg total) by mouth 2 (two) times daily.   28 capsule   0   . sucralfate (CARAFATE) 1 G tablet   Oral   Take 1 tablet (1 g total) by mouth 4 (four) times daily.   120 tablet   1     Allergies Review of patient's allergies indicates no known allergies.  No family history on file.  Social History Social History  Substance Use Topics  . Smoking status: Current Every Day Smoker  . Smokeless tobacco: None  . Alcohol  Use: No     Comment: beer/day    Review of Systems  Constitutional: Negative for fever. Cardiovascular: Negative for chest pain. Respiratory: Negative for shortness of breath. Gastrointestinal: Negative for abdominal pain, vomiting and diarrhea. Genitourinary: Negative for dysuria. Musculoskeletal: Negative for back pain. Skin: Negative for rash. Neurological: Negative for headaches, focal weakness or numbness.  10-point ROS otherwise negative.  ____________________________________________   PHYSICAL EXAM:  VITAL SIGNS: ED Triage Vitals  Enc Vitals Group     BP 05/19/15 2044 120/82 mmHg     Pulse Rate 05/19/15 2044 57     Resp 05/19/15 2044 18     Temp 05/19/15 2044 98.2 F (36.8 C)     Temp Source 05/19/15 2044 Oral     SpO2 05/19/15 2044 100 %     Weight 05/19/15 2044 105 lb (47.628 kg)     Height 05/19/15 2044  (1.6 m)     Head Cir --      Peak Flow --      Pain Score 05/19/15 2045 8   Constitutional: Alert and oriented. Well appearing and in no distress. Eyes: Conjunctivae are normal. PERRL. Normal extraocular movements. ENT   Head: Normocephalic and atraumatic.   Nose: No congestion/rhinnorhea.   Mouth/Throat: Mucous membranes are moist.   Neck: No stridor. Hematological/Lymphatic/Immunilogical: No cervical lymphadenopathy. Cardiovascular:  Normal rate, regular rhythm.  No murmurs, rubs, or gallops. Respiratory: Normal respiratory effort without tachypnea nor retractions. Breath sounds are clear and equal bilaterally. No wheezes/rales/rhonchi. Gastrointestinal: Soft and nontender. No distention.  Genitourinary: No external lesions. No blood in vaginal vault. Small amount of discharge. No cervical motion tenderness. No adnexal tenderness or fullness. Musculoskeletal: Normal range of motion in all extremities. No joint effusions.  No lower extremity tenderness nor edema. Neurologic:  Normal speech and language. No gross focal neurologic deficits  are appreciated. Speech is normal.  Skin:  Skin is warm, dry and intact. No rash noted. Psychiatric: Mood and affect are normal. Speech and behavior are normal. Patient exhibits appropriate insight and judgment.  ____________________________________________    LABS (pertinent positives/negatives)  Labs Reviewed  WET PREP, GENITAL - Abnormal; Notable for the following:    WBC, Wet Prep HPF POC FEW (*)    All other components within normal limits  URINALYSIS COMPLETEWITH MICROSCOPIC ([facility] ONLY) - Abnormal; Notable for the following:    Color, Urine STRAW (*)    APPearance CLEAR (*)    Specific Gravity, Urine 1.003 (*)    Hgb urine dipstick 1+ (*)    Squamous Epithelial / LPF 0-5 (*)    All other components within normal limits  CHLAMYDIA/NGC RT PCR ([facility] ONLY)  POC URINE PREG, ED     ____________________________________________   EKG  None  ____________________________________________    RADIOLOGY  None   ____________________________________________   PROCEDURES  Procedure(s) performed: None  Critical Care performed: No  ____________________________________________   INITIAL IMPRESSION / ASSESSMENT AND PLAN / ED COURSE  Pertinent labs & imaging results that were available during my care of the patient were reviewed by me and considered in my medical decision making (see chart for details).  Patient presented to the emergency department tonight because of concerns for vaginal discharge. On exam no concerning lesions. Wet prep negative. UA negative.  ____________________________________________   FINAL CLINICAL IMPRESSION(S) / ED DIAGNOSES  Final diagnoses:  Vaginal discharge     Phineas Semen, MD 05/20/15 [phone]

## 2015-07-31 ENCOUNTER — Emergency Department
Admission: EM | Admit: 2015-07-31 | Discharge: 2015-07-31 | Disposition: A | Discharge status: Home / Self Care | Payer: Medicaid Other | Attending: Student | Admitting: Student

## 2015-07-31 ENCOUNTER — Encounter: Payer: Self-pay | Admitting: Emergency Medicine

## 2015-07-31 DIAGNOSIS — N390 Urinary tract infection, site not specified: Secondary | ICD-10-CM

## 2015-07-31 DIAGNOSIS — Z3202 Encounter for pregnancy test, result negative: Secondary | ICD-10-CM | POA: Insufficient documentation

## 2015-07-31 DIAGNOSIS — F17200 Nicotine dependence, unspecified, uncomplicated: Secondary | ICD-10-CM | POA: Insufficient documentation

## 2015-07-31 DIAGNOSIS — Z79899 Other long term (current) drug therapy: Secondary | ICD-10-CM | POA: Insufficient documentation

## 2015-07-31 LAB — URINALYSIS COMPLETE WITH MICROSCOPIC (ARMC ONLY)
BACTERIA UA: NONE SEEN
Bilirubin Urine: NEGATIVE
Glucose, UA: NEGATIVE mg/dL
KETONES UR: NEGATIVE mg/dL
Nitrite: NEGATIVE
PROTEIN: 30 mg/dL — AB
SPECIFIC GRAVITY, URINE: 1.018 (ref 1.005–1.030)
pH: 8 (ref 5.0–8.0)

## 2015-07-31 LAB — POCT PREGNANCY, URINE: PREG TEST UR: NEGATIVE

## 2015-07-31 MED ORDER — PHENAZOPYRIDINE HCL 200 MG PO TABS
200.0000 mg | ORAL_TABLET | Freq: Once | ORAL | Status: AC
  Administered 2015-07-31: 200 mg via ORAL
  Filled 2015-07-31: qty 1

## 2015-07-31 MED ORDER — PHENAZOPYRIDINE HCL 200 MG PO TABS: 200.0000 mg | ORAL_TABLET | Freq: Three times a day (TID) | ORAL | Status: DC | PRN

## 2015-07-31 MED ORDER — SULFAMETHOXAZOLE-TRIMETHOPRIM 800-160 MG PO TABS: 1.0000 | ORAL_TABLET | Freq: Two times a day (BID) | ORAL | Status: DC

## 2015-07-31 MED ORDER — SULFAMETHOXAZOLE-TRIMETHOPRIM 800-160 MG PO TABS
1.0000 | ORAL_TABLET | Freq: Once | ORAL | Status: AC
  Administered 2015-07-31: 1 via ORAL
  Filled 2015-07-31: qty 1

## 2015-07-31 NOTE — ED Provider Notes (Signed)
[facility] Emergency Department Provider Note  ____________________________________________  Time seen: Approximately 11:57 AM  I have reviewed the triage vital signs and the nursing notes.   HISTORY  Chief Complaint Dysuria    HPI Kimberly Powers is a 19 y.o. female patient complain a.m. awakening of dysuria and frequency. Patient denies any vaginal discharge or fever. He states some mild center back pain. He is rating her pain discomfort as as a 9/10. Patient described a pain as a burning sensation upon voiding. No palliative measures taken this complaint.   History reviewed. No pertinent past medical history.  There are no active problems to display for this patient.   History reviewed. No pertinent past surgical history.  Current Outpatient Rx  Name  Route  Sig  Dispense  Refill  . ibuprofen (ADVIL,MOTRIN) 800 MG tablet   Oral   Take 1 tablet (800 mg total) by mouth every 8 (eight) hours as needed. Patient not taking: Reported on 05/20/2015   30 tablet   0   . metroNIDAZOLE (FLAGYL) 500 MG tablet   Oral   Take 1 tablet (500 mg total) by mouth 2 (two) times daily.   14 tablet   0   . phenazopyridine (PYRIDIUM) 200 MG tablet   Oral   Take 1 tablet (200 mg total) by mouth 3 (three) times daily as needed for pain.   6 tablet   0   . ranitidine (ZANTAC) 150 MG capsule   Oral   Take 1 capsule (150 mg total) by mouth 2 (two) times daily. Patient not taking: Reported on 05/20/2015   28 capsule   0   . sucralfate (CARAFATE) 1 G tablet   Oral   Take 1 tablet (1 g total) by mouth 4 (four) times daily. Patient not taking: Reported on 05/20/2015   120 tablet   1   . sulfamethoxazole-trimethoprim (BACTRIM DS,SEPTRA DS) 800-160 MG tablet   Oral   Take 1 tablet by mouth 2 (two) times daily.   20 tablet   0     Allergies Review of patient's allergies indicates no known allergies.  History reviewed. No pertinent family  history.  Social History Social History  Substance Use Topics  . Smoking status: Current Every Day Smoker  . Smokeless tobacco: None  . Alcohol Use: No     Comment: beer/day    Review of Systems Constitutional: No fever/chills Eyes: No visual changes. ENT: No sore throat. Cardiovascular: Denies chest pain. Respiratory: Denies shortness of breath. Gastrointestinal: No abdominal pain.  No nausea, no vomiting.  No diarrhea.  No constipation. Genitourinary: Negative for dysuria. Musculoskeletal: Negative for back pain. Skin: Negative for rash. Neurological: Negative for headaches, focal weakness or numbness. 10-point ROS otherwise negative.  ____________________________________________   PHYSICAL EXAM:  VITAL SIGNS: ED Triage Vitals  Enc Vitals Group     BP 07/31/15 1150 105/71 mmHg     Pulse Rate 07/31/15 1150 66     Resp 07/31/15 1150 18     Temp 07/31/15 1150 98.3 F (36.8 C)     Temp Source 07/31/15 1150 Oral     SpO2 07/31/15 1150 100 %     Weight 07/31/15 1150 105 lb (47.628 kg)     Height 07/31/15 1150  (1.6 m)     Head Cir --      Peak Flow --      Pain Score 07/31/15 1152 9     Pain Loc --  Pain Edu? --      Excl. in GC? --     Constitutional: Alert and oriented. Well appearing and in no acute distress. Eyes: Conjunctivae are normal. PERRL. EOMI. Head: Atraumatic. Nose: No congestion/rhinnorhea. Mouth/Throat: Mucous membranes are moist.  Oropharynx non-erythematous. Neck: No stridor.  No cervical spine tenderness to palpation. Hematological/Lymphatic/Immunilogical: No cervical lymphadenopathy. Cardiovascular: Normal rate, regular rhythm. Grossly normal heart sounds.  Good peripheral circulation. Respiratory: Normal respiratory effort.  No retractions. Lungs CTAB. Gastrointestinal: Soft and nontender. No distention. No abdominal bruits. No CVA tenderness. Genitourinary: Deferred Musculoskeletal: No lower extremity tenderness nor edema.  No  joint effusions. Neurologic:  Normal speech and language. No gross focal neurologic deficits are appreciated. No gait instability. Skin:  Skin is warm, dry and intact. No rash noted. Psychiatric: Mood and affect are normal. Speech and behavior are normal.  ____________________________________________   LABS (all labs ordered are listed, but only abnormal results are displayed)  Labs Reviewed  URINALYSIS COMPLETEWITH MICROSCOPIC ([facility] ONLY) - Abnormal; Notable for the following:    Color, Urine YELLOW (*)    APPearance CLOUDY (*)    Hgb urine dipstick 2+ (*)    Protein, ur 30 (*)    Leukocytes, UA 3+ (*)    Squamous Epithelial / LPF 0-5 (*)    All other components within normal limits  URINE CULTURE  POC URINE PREG, ED  POCT PREGNANCY, URINE   ____________________________________________  EKG   ____________________________________________  RADIOLOGY   ____________________________________________   PROCEDURES  Procedure(s) performed: None  Critical Care performed: No  ____________________________________________   INITIAL IMPRESSION / ASSESSMENT AND PLAN / ED COURSE  Pertinent labs & imaging results that were available during my care of the patient were reviewed by me and considered in my medical decision making (see chart for details).  Urinary tract infection. Patient advised urine culture is pending. Patient given discharge home care instructions. Patient given prescription for Bactrim DS and Pyridium. Patient advised follow-up with family doctor in one week to retest her urine. ____________________________________________   FINAL CLINICAL IMPRESSION(S) / ED DIAGNOSES  Final diagnoses:  UTI (lower urinary tract infection)      Joni Reiningonald K Keriann Rankin, PA-C 07/31/15 1229  Gayla DossEryka A Gayle, MD 07/31/15 1535

## 2015-07-31 NOTE — ED Notes (Signed)
Pain with urination

## 2015-08-03 LAB — URINE CULTURE: Special Requests: NORMAL

## 2015-08-04 NOTE — Progress Notes (Signed)
Patient was seen in ED on 12/17 with complaints of dysuria. She was diagnosed with a UTI and discharged on Bactrim 1 DS tablet BID x 10 days.  Urine culture resulted today with E. Coli which was resistant to Bactrim. Patient has NKDA. Called patient and explained culture results and change in antibiotics.  The following prescription was called into [facility] pharmacy on [address] in [place], [place]  Cephalexin 500 mg PO BID x 10 days with no refills MD: Mahlon GammonEryka Gale  Bernarda Erck M Briseyda Fehr 7:39 PM

## 2015-10-29 ENCOUNTER — Emergency Department
Admission: EM | Admit: 2015-10-29 | Discharge: 2015-10-29 | Disposition: A | Discharge status: Home / Self Care | Payer: Medicaid Other | Attending: Emergency Medicine | Admitting: Emergency Medicine

## 2015-10-29 ENCOUNTER — Encounter: Payer: Self-pay | Admitting: Emergency Medicine

## 2015-10-29 DIAGNOSIS — N898 Other specified noninflammatory disorders of vagina: Secondary | ICD-10-CM | POA: Insufficient documentation

## 2015-10-29 DIAGNOSIS — B9689 Other specified bacterial agents as the cause of diseases classified elsewhere: Secondary | ICD-10-CM

## 2015-10-29 DIAGNOSIS — Z202 Contact with and (suspected) exposure to infections with a predominantly sexual mode of transmission: Secondary | ICD-10-CM | POA: Insufficient documentation

## 2015-10-29 DIAGNOSIS — N760 Acute vaginitis: Secondary | ICD-10-CM

## 2015-10-29 DIAGNOSIS — F17200 Nicotine dependence, unspecified, uncomplicated: Secondary | ICD-10-CM | POA: Insufficient documentation

## 2015-10-29 DIAGNOSIS — A499 Bacterial infection, unspecified: Secondary | ICD-10-CM | POA: Insufficient documentation

## 2015-10-29 LAB — URINALYSIS COMPLETE WITH MICROSCOPIC (ARMC ONLY)
BACTERIA UA: NONE SEEN
BILIRUBIN URINE: NEGATIVE
GLUCOSE, UA: NEGATIVE mg/dL
LEUKOCYTES UA: NEGATIVE
NITRITE: NEGATIVE
Protein, ur: NEGATIVE mg/dL
RBC / HPF: NONE SEEN RBC/hpf (ref 0–5)
SPECIFIC GRAVITY, URINE: 1.021 (ref 1.005–1.030)
pH: 5 (ref 5.0–8.0)

## 2015-10-29 LAB — WET PREP, GENITAL
Clue Cells Wet Prep HPF POC: POSITIVE — AB
SPERM: NONE SEEN
Trich, Wet Prep: NONE SEEN
WBC, Wet Prep HPF POC: NONE SEEN
Yeast Wet Prep HPF POC: NONE SEEN

## 2015-10-29 LAB — POCT PREGNANCY, URINE: PREG TEST UR: NEGATIVE

## 2015-10-29 LAB — CHLAMYDIA/NGC RT PCR (ARMC ONLY)
CHLAMYDIA TR: NOT DETECTED
N GONORRHOEAE: NOT DETECTED

## 2015-10-29 MED ORDER — CEFTRIAXONE SODIUM 250 MG IJ SOLR
250.0000 mg | Freq: Once | INTRAMUSCULAR | Status: AC
  Administered 2015-10-29: 250 mg via INTRAMUSCULAR
  Filled 2015-10-29: qty 250

## 2015-10-29 MED ORDER — AZITHROMYCIN 500 MG PO TABS
1000.0000 mg | ORAL_TABLET | Freq: Once | ORAL | Status: AC
  Administered 2015-10-29: 1000 mg via ORAL
  Filled 2015-10-29: qty 2

## 2015-10-29 MED ORDER — METRONIDAZOLE 500 MG PO TABS
500.0000 mg | ORAL_TABLET | Freq: Two times a day (BID) | ORAL | Status: DC
Start: 2015-10-29 — End: 2016-02-16

## 2015-10-29 NOTE — Discharge Instructions (Signed)
Bacterial Vaginosis Bacterial vaginosis is an infection of the vagina. It happens when too many germs (bacteria) grow in the vagina. Having this infection puts you at risk for getting other infections from sex. Treating this infection can help lower your risk for other infections, such as:   Chlamydia.  Gonorrhea.  HIV.  Herpes. HOME CARE  Take your medicine as told by your doctor.  Finish your medicine even if you start to feel better.  Tell your sex partner that you have an infection. They should see their doctor for treatment.  During treatment:  Avoid sex or use condoms correctly.  Do not douche.  Do not drink alcohol unless your doctor tells you it is ok.  Do not breastfeed unless your doctor tells you it is ok. GET HELP IF:  You are not getting better after 3 days of treatment.  You have more grey fluid (discharge) coming from your vagina than before.  You have more pain than before.  You have a fever. MAKE SURE YOU:   Understand these instructions.  Will watch your condition.  Will get help right away if you are not doing well or get worse.   This information is not intended to replace advice given to you by your health care provider. Make sure you discuss any questions you have with your health care provider.   Document Released: 05/09/2008 Document Revised: 08/21/2014 Document Reviewed: 03/12/2013 Elsevier Interactive Patient Education 2016 [employer]   Call to make an appointment follow-up with [facility]. You have arty been treated for STDs in the emergency room with your anabiotic injection and 4 pills. You need to get the prescription for Flagyl filled and take as directed. Do not drink any alcohol with this medication as it may cause extreme vomiting.

## 2015-10-29 NOTE — ED Provider Notes (Signed)
[facility] Emergency Department Provider Note  ____________________________________________  Time seen: Approximately 2:18 PM  I have reviewed the triage vital signs and the nursing notes.   HISTORY  Chief Complaint Vaginal Discharge   HPI Kimberly Powers is a 20 y.o. female is here with vaginal discharge and itching such as today. Patient states she was recently exposed to STD and that she got a call from her sexual partner state that he had gonorrhea. This is second time that she has had gonorrhea and this is the same person that she had STD contact with. Patient states that they do not use condoms. Female partner has only been treated. Patient denies any fever, chills, nausea or vomiting. She states that the vaginal discharge has been white/yellowish. She denies any abdominal pain at this time.   History reviewed. No pertinent past medical history.  There are no active problems to display for this patient.   History reviewed. No pertinent past surgical history.  Current Outpatient Rx  Name  Route  Sig  Dispense  Refill  . ibuprofen (ADVIL,MOTRIN) 800 MG tablet   Oral   Take 1 tablet (800 mg total) by mouth every 8 (eight) hours as needed. Patient not taking: Reported on 05/20/2015   30 tablet   0   . metroNIDAZOLE (FLAGYL) 500 MG tablet   Oral   Take 1 tablet (500 mg total) by mouth 2 (two) times daily.   14 tablet   0   . phenazopyridine (PYRIDIUM) 200 MG tablet   Oral   Take 1 tablet (200 mg total) by mouth 3 (three) times daily as needed for pain.   6 tablet   0   . ranitidine (ZANTAC) 150 MG capsule   Oral   Take 1 capsule (150 mg total) by mouth 2 (two) times daily. Patient not taking: Reported on 05/20/2015   28 capsule   0   . sucralfate (CARAFATE) 1 G tablet   Oral   Take 1 tablet (1 g total) by mouth 4 (four) times daily. Patient not taking: Reported on 05/20/2015   120 tablet   1   . sulfamethoxazole-trimethoprim (BACTRIM  DS,SEPTRA DS) 800-160 MG tablet   Oral   Take 1 tablet by mouth 2 (two) times daily.   20 tablet   0     Allergies Review of patient's allergies indicates no known allergies.  No family history on file.  Social History Social History  Substance Use Topics  . Smoking status: Current Every Day Smoker  . Smokeless tobacco: None  . Alcohol Use: No     Comment: beer/day    Review of Systems Constitutional: No fever/chills Cardiovascular: Denies chest pain. Respiratory: Denies shortness of breath. Gastrointestinal: No abdominal pain.  No nausea, no vomiting.   Genitourinary: Negative for dysuria. Musculoskeletal: Negative for back pain. Skin: Negative for rash. Neurological: Negative for headaches, focal weakness or numbness.  10-point ROS otherwise negative.  ____________________________________________   PHYSICAL EXAM:  VITAL SIGNS: ED Triage Vitals  Enc Vitals Group     BP 10/29/15 1251 109/69 mmHg     Pulse Rate 10/29/15 1251 100     Resp 10/29/15 1251 20     Temp 10/29/15 1251 98.1 F (36.7 C)     Temp Source 10/29/15 1251 Oral     SpO2 10/29/15 1251 98 %     Weight 10/29/15 1251 106 lb (48.081 kg)     Height 10/29/15 1251  (1.6 m)  Head Cir --      Peak Flow --      Pain Score --      Pain Loc --      Pain Edu? --      Excl. in GC? --     Constitutional: Alert and oriented. Well appearing and in no acute distress. Eyes: Conjunctivae are normal. PERRL. EOMI. Head: Atraumatic. Nose: No congestion/rhinnorhea. Neck: No stridor.   Cardiovascular: Normal rate, regular rhythm. Grossly normal heart sounds.  Good peripheral circulation. Respiratory: Normal respiratory effort.  No retractions. Lungs CTAB. Genitourinary: Pelvic exam was performed. No external abnormality seen. There is on vaginal exam a dark mixed with white vaginal discharge. There is some minimal bilateral adnexal tenderness but no masses were noted. Negative for chandelier  sign. Musculoskeletal: No lower extremity tenderness nor edema.  No joint effusions. Neurologic:  Normal speech and language. No gross focal neurologic deficits are appreciated. No gait instability. Skin:  Skin is warm, dry and intact. No rash noted. Psychiatric: Mood and affect are normal. Speech and behavior are normal.  ____________________________________________   LABS (all labs ordered are listed, but only abnormal results are displayed)  Labs Reviewed  WET PREP, GENITAL - Abnormal; Notable for the following:    Clue Cells Wet Prep HPF POC POSITIVE (*)    All other components within normal limits  URINALYSIS COMPLETEWITH MICROSCOPIC ([facility] ONLY) - Abnormal; Notable for the following:    Color, Urine YELLOW (*)    APPearance CLEAR (*)    Ketones, ur 1+ (*)    Hgb urine dipstick 1+ (*)    Squamous Epithelial / LPF 0-5 (*)    All other components within normal limits  CHLAMYDIA/NGC RT PCR ([facility] ONLY)  POC URINE PREG, ED  POCT PREGNANCY, URINE    PROCEDURES  Procedure(s) performed: None  Critical Care performed: No  ____________________________________________   INITIAL IMPRESSION / ASSESSMENT AND PLAN / ED COURSE  Pertinent labs & imaging results that were available during my care of the patient were reviewed by me and considered in my medical decision making (see chart for details).  Patient was informed that most likely she has 3 different vaginal infections. She has clue cells indicating bacterial vaginitis but most likely because of the positive exposure to STD we will treat for both gonorrhea and chlamydia. We discussed use of condoms and choice of sexual partner since this is second time she has gotten gonorrhea from same person. [facility]. ____________________________________________   FINAL CLINICAL IMPRESSION(S) / ED DIAGNOSES  Final diagnoses:  Bacterial vaginal infection  Exposure to STD       Tommi Rumpshonda L Lavi Sheehan, PA-C 10/29/15 1641  Jene Everyobert Kinner, MD 10/30/15 [phone]

## 2015-10-29 NOTE — ED Notes (Signed)
Developed some vaginal discharge and itching since yesterday  Recently exposed to STD

## 2015-11-01 ENCOUNTER — Telehealth: Payer: Self-pay | Admitting: Emergency Medicine

## 2015-11-01 NOTE — ED Notes (Signed)
Pt called asking for std results.  Gave her negative results.

## 2015-11-08 ENCOUNTER — Emergency Department
Admission: EM | Admit: 2015-11-08 | Discharge: 2015-11-08 | Disposition: A | Discharge status: Home / Self Care | Payer: Medicaid Other | Attending: Emergency Medicine | Admitting: Emergency Medicine

## 2015-11-08 DIAGNOSIS — F17200 Nicotine dependence, unspecified, uncomplicated: Secondary | ICD-10-CM | POA: Insufficient documentation

## 2015-11-08 DIAGNOSIS — N760 Acute vaginitis: Secondary | ICD-10-CM | POA: Insufficient documentation

## 2015-11-08 DIAGNOSIS — Z791 Long term (current) use of non-steroidal anti-inflammatories (NSAID): Secondary | ICD-10-CM | POA: Insufficient documentation

## 2015-11-08 DIAGNOSIS — R110 Nausea: Secondary | ICD-10-CM | POA: Insufficient documentation

## 2015-11-08 DIAGNOSIS — Z79899 Other long term (current) drug therapy: Secondary | ICD-10-CM | POA: Insufficient documentation

## 2015-11-08 DIAGNOSIS — Z792 Long term (current) use of antibiotics: Secondary | ICD-10-CM | POA: Insufficient documentation

## 2015-11-08 LAB — CBC
HEMATOCRIT: 40.5 % (ref 35.0–47.0)
Hemoglobin: 13.9 g/dL (ref 12.0–16.0)
MCH: 32.3 pg (ref 26.0–34.0)
MCHC: 34.3 g/dL (ref 32.0–36.0)
MCV: 94.2 fL (ref 80.0–100.0)
Platelets: 145 10*3/uL — ABNORMAL LOW (ref 150–440)
RBC: 4.3 MIL/uL (ref 3.80–5.20)
RDW: 13 % (ref 11.5–14.5)
WBC: 3.5 10*3/uL — AB (ref 3.6–11.0)

## 2015-11-08 LAB — URINALYSIS COMPLETE WITH MICROSCOPIC (ARMC ONLY)
BILIRUBIN URINE: NEGATIVE
Bacteria, UA: NONE SEEN
GLUCOSE, UA: NEGATIVE mg/dL
Hgb urine dipstick: NEGATIVE
Nitrite: NEGATIVE
Protein, ur: NEGATIVE mg/dL
Specific Gravity, Urine: 1.019 (ref 1.005–1.030)
pH: 6 (ref 5.0–8.0)

## 2015-11-08 LAB — COMPREHENSIVE METABOLIC PANEL
ALBUMIN: 4.2 g/dL (ref 3.5–5.0)
ALK PHOS: 52 U/L (ref 38–126)
ALT: 18 U/L (ref 14–54)
AST: 23 U/L (ref 15–41)
Anion gap: 5 (ref 5–15)
BILIRUBIN TOTAL: 0.5 mg/dL (ref 0.3–1.2)
BUN: 11 mg/dL (ref 6–20)
CALCIUM: 9.4 mg/dL (ref 8.9–10.3)
CO2: 23 mmol/L (ref 22–32)
Chloride: 107 mmol/L (ref 101–111)
Creatinine, Ser: 0.78 mg/dL (ref 0.44–1.00)
GFR calc Af Amer: >60 mL/min (ref >60)
GFR calc non Af Amer: >60 mL/min (ref >60)
GLUCOSE: 89 mg/dL (ref 65–99)
Potassium: 3.9 mmol/L (ref 3.5–5.1)
Sodium: 135 mmol/L (ref 135–145)
TOTAL PROTEIN: 7.3 g/dL (ref 6.5–8.1)

## 2015-11-08 LAB — POCT PREGNANCY, URINE: Preg Test, Ur: NEGATIVE

## 2015-11-08 MED ORDER — FLUCONAZOLE 100 MG PO TABS
200.0000 mg | ORAL_TABLET | Freq: Once | ORAL | Status: AC
  Administered 2015-11-08: 200 mg via ORAL
  Filled 2015-11-08: qty 2

## 2015-11-08 MED ORDER — MICONAZOLE NITRATE 1200 & 2 MG & % VA KIT: 1.0000 | PACK | Freq: Every day | VAGINAL | Status: AC

## 2015-11-08 NOTE — ED Provider Notes (Signed)
[facility] Emergency Department Provider Note  ____________________________________________  Time seen: 1:10 PM  I have reviewed the triage vital signs and the nursing notes.   HISTORY  Chief Complaint Nausea and Vaginal Discharge    HPI Kimberly Powers is a 20 y.o. female who complains of thick vaginal discharge and vaginal itching for the past day. She's also had some nausea and breast tenderness for a week. She was recently treated a week ago presumptively for STI with ceftriaxone and azithromycin as well as a week of Flagyl for bacterial vaginosis. Her labs actually came back negative for gonorrhea and chlamydia. She denies any other symptoms.     History reviewed. No pertinent past medical history.   There are no active problems to display for this patient.    History reviewed. No pertinent past surgical history.   Current Outpatient Rx  Name  Route  Sig  Dispense  Refill  . ibuprofen (ADVIL,MOTRIN) 800 MG tablet   Oral   Take 1 tablet (800 mg total) by mouth every 8 (eight) hours as needed. Patient not taking: Reported on 05/20/2015   30 tablet   0   . metroNIDAZOLE (FLAGYL) 500 MG tablet   Oral   Take 1 tablet (500 mg total) by mouth 2 (two) times daily.   14 tablet   0   . miconazole (MONISTAT 1 COMBO PACK) kit   Vaginal   Place 1 each vaginally daily.   5 each   0   . phenazopyridine (PYRIDIUM) 200 MG tablet   Oral   Take 1 tablet (200 mg total) by mouth 3 (three) times daily as needed for pain.   6 tablet   0   . ranitidine (ZANTAC) 150 MG capsule   Oral   Take 1 capsule (150 mg total) by mouth 2 (two) times daily. Patient not taking: Reported on 05/20/2015   28 capsule   0   . sucralfate (CARAFATE) 1 G tablet   Oral   Take 1 tablet (1 g total) by mouth 4 (four) times daily. Patient not taking: Reported on 05/20/2015   120 tablet   1   . sulfamethoxazole-trimethoprim (BACTRIM DS,SEPTRA DS) 800-160 MG tablet  Oral   Take 1 tablet by mouth 2 (two) times daily.   20 tablet   0      Allergies Review of patient's allergies indicates no known allergies.   No family history on file.  Social History Social History  Substance Use Topics  . Smoking status: Current Every Day Smoker  . Smokeless tobacco: None  . Alcohol Use: No     Comment: beer/day    Review of Systems  Constitutional:   No fever or chills. No weight changes Eyes:   No vision changes.  ENT:   No sore throat. No rhinorrhea. Cardiovascular:   No chest pain. Respiratory:   No dyspnea or cough. Gastrointestinal:   Negative for abdominal pain, vomiting and diarrhea.  No BRBPR or melena. Genitourinary:   Negative for dysuria or difficulty urinating. Musculoskeletal:   Negative for focal pain or swelling Skin:   Negative for rash. Neurological:   Negative for headaches, focal weakness or numbness.  10-point ROS otherwise negative.  ____________________________________________   PHYSICAL EXAM:  VITAL SIGNS: ED Triage Vitals  Enc Vitals Group     BP 11/08/15 1020 114/74 mmHg     Pulse Rate 11/08/15 1020 73     Resp 11/08/15 1020 16     Temp 11/08/15  1020 97.7 F (36.5 C)     Temp Source 11/08/15 1020 Oral     SpO2 11/08/15 1020 100 %     Weight 11/08/15 1020 106 lb (48.081 kg)     Height 11/08/15 1020 '5\' 3"'$  (1.6 m)     Head Cir --      Peak Flow --      Pain Score 11/08/15 1021 0     Pain Loc --      Pain Edu? --      Excl. in [place]? --     Vital signs reviewed, nursing assessments reviewed.   Constitutional:   Alert and oriented. Well appearing and in no distress. Eyes:   No scleral icterus. No conjunctival pallor. PERRL. EOMI ENT   Head:   Normocephalic and atraumatic.   Nose:   No congestion/rhinnorhea. No septal hematoma   Mouth/Throat:   MMM, no pharyngeal erythema. No peritonsillar mass.    Neck:   No stridor. No SubQ emphysema. No meningismus. Hematological/Lymphatic/Immunilogical:    No cervical lymphadenopathy. Cardiovascular:   RRR. Symmetric bilateral radial and DP pulses.  No murmurs.  Respiratory:   Normal respiratory effort without tachypnea nor retractions. Breath sounds are clear and equal bilaterally. No wheezes/rales/rhonchi. Gastrointestinal:   Soft and nontender. Non distended. There is no CVA tenderness.  No rebound, rigidity, or guarding. Genitourinary:   deferred Musculoskeletal:   Nontender with normal range of motion in all extremities. No joint effusions.  No lower extremity tenderness.  No edema. Neurologic:   Normal speech and language.  CN 2-10 normal. Motor grossly intact. No gross focal neurologic deficits are appreciated.  Skin:    Skin is warm, dry and intact. No rash noted.  No petechiae, purpura, or bullae. Psychiatric:   Mood and affect are normal. ____________________________________________    LABS (pertinent positives/negatives) (all labs ordered are listed, but only abnormal results are displayed) Labs Reviewed  CBC - Abnormal; Notable for the following:    WBC 3.5 (*)    Platelets 145 (*)    All other components within normal limits  URINALYSIS COMPLETEWITH MICROSCOPIC ([facility] ONLY) - Abnormal; Notable for the following:    Color, Urine YELLOW (*)    APPearance HAZY (*)    Ketones, ur TRACE (*)    Leukocytes, UA 1+ (*)    Squamous Epithelial / LPF 6-30 (*)    All other components within normal limits  COMPREHENSIVE METABOLIC PANEL  POC URINE PREG, ED  POCT PREGNANCY, URINE   ____________________________________________   EKG    ____________________________________________    RADIOLOGY    ____________________________________________   PROCEDURES   ____________________________________________   INITIAL IMPRESSION / ASSESSMENT AND PLAN / ED COURSE  Pertinent labs & imaging results that were available during my care of the patient were reviewed by me and considered in my medical decision making (see chart for  details).  Patient well appearing no acute distress. Recently proven negative Chlamydia gonorrhea, abdominal exam unremarkable, recently completed multiple antibiotics. Very likely candidal vaginitis. We'll give a dose of Diflucan. Also advised her that she could take Monistat or Vagisil from a drug store if needed. Follow-up with primary care at [facility] in a week. Low suspicion for PID TOA torsion or pregnancy. Labs are all unremarkable. Vital signs normal.     ____________________________________________   FINAL CLINICAL IMPRESSION(S) / ED DIAGNOSES  Final diagnoses:  Vaginitis      Carrie Mew, MD 11/08/15 1352

## 2015-11-08 NOTE — Discharge Instructions (Signed)

## 2015-11-08 NOTE — ED Notes (Signed)
Pt c/o thick vaginal discharge for the past day, pt also c/o nausea with breast tenderness for the past week..Marland Kitchen

## 2016-02-08 ENCOUNTER — Emergency Department
Admission: EM | Admit: 2016-02-08 | Discharge: 2016-02-08 | Disposition: A | Discharge status: Home / Self Care | Payer: Medicaid Other | Attending: Emergency Medicine | Admitting: Emergency Medicine

## 2016-02-08 ENCOUNTER — Encounter: Payer: Self-pay | Admitting: Emergency Medicine

## 2016-02-08 DIAGNOSIS — B373 Candidiasis of vulva and vagina: Secondary | ICD-10-CM

## 2016-02-08 DIAGNOSIS — F17210 Nicotine dependence, cigarettes, uncomplicated: Secondary | ICD-10-CM | POA: Insufficient documentation

## 2016-02-08 DIAGNOSIS — Z79899 Other long term (current) drug therapy: Secondary | ICD-10-CM | POA: Insufficient documentation

## 2016-02-08 DIAGNOSIS — Z792 Long term (current) use of antibiotics: Secondary | ICD-10-CM | POA: Insufficient documentation

## 2016-02-08 DIAGNOSIS — B3731 Acute candidiasis of vulva and vagina: Secondary | ICD-10-CM

## 2016-02-08 DIAGNOSIS — Z791 Long term (current) use of non-steroidal anti-inflammatories (NSAID): Secondary | ICD-10-CM | POA: Insufficient documentation

## 2016-02-08 LAB — POCT PREGNANCY, URINE: PREG TEST UR: NEGATIVE

## 2016-02-08 LAB — WET PREP, GENITAL
CLUE CELLS WET PREP: NONE SEEN
Sperm: NONE SEEN
Trich, Wet Prep: NONE SEEN
WBC, Wet Prep HPF POC: NONE SEEN
Yeast Wet Prep HPF POC: NONE SEEN

## 2016-02-08 LAB — CHLAMYDIA/NGC RT PCR (ARMC ONLY)
CHLAMYDIA TR: NOT DETECTED
N GONORRHOEAE: NOT DETECTED

## 2016-02-08 MED ORDER — FLUCONAZOLE 150 MG PO TABS: 150.0000 mg | ORAL_TABLET | Freq: Once | ORAL | Status: DC

## 2016-02-08 NOTE — Discharge Instructions (Signed)

## 2016-02-08 NOTE — ED Provider Notes (Signed)
Kimberly Powers        Time seen: ----------------------------------------- 1:13 PM on 02/08/2016 -----------------------------------------    I have reviewed the triage vital signs and the nursing notes.   HISTORY  Chief Complaint Vaginal Itching    HPI Kimberly Powers is a 20 y.o. female who presents ER for 2 days of vaginal itching and swelling to the vaginal area. Patient states she's had white thick discharge with slight odor and some nausea. She has not had these symptoms before. She denies fevers chills or other complaints.She states she has had actual vaginosis, yeast and an STD in the past.   History reviewed. No pertinent past medical history.  There are no active problems to display for this patient.   History reviewed. No pertinent past surgical history.  Allergies Review of patient's allergies indicates no known allergies.  Social History Social History  Substance Use Topics  . Smoking status: Current Every Day Smoker -- 0.25 packs/day    Types: Cigarettes  . Smokeless tobacco: None  . Alcohol Use: 3.6 oz/week    6 Cans of beer per week    Review of Systems Constitutional: Negative for fever. Cardiovascular: Negative for chest pain. Respiratory: Negative for shortness of breath. Gastrointestinal: Negative for abdominal pain, vomiting and diarrhea. Genitourinary: Negative for dysuria.Positive for vaginal discharge Musculoskeletal: Negative for back pain. Skin: Negative for rash. Neurological: Negative for headaches, focal weakness or numbness.  10-point ROS otherwise negative.  ____________________________________________   PHYSICAL EXAM:  VITAL SIGNS: ED Triage Vitals  Enc Vitals Group     BP 02/08/16 1251 108/73 mmHg     Pulse Rate 02/08/16 1251 69     Resp --      Temp 02/08/16 1251 98 F (36.7 C)     Temp Source 02/08/16 1251 Oral     SpO2 02/08/16 1251 100 %     Weight  02/08/16 1251 105 lb (47.628 kg)     Height 02/08/16 1251 5\' 3"  (1.6 m)     Head Cir --      Peak Flow --      Pain Score --      Pain Loc --      Pain Edu? --      Excl. in GC? --     Constitutional: Alert and oriented. Well appearing and in no distress. Eyes: Conjunctivae are normal. PERRL. Normal extraocular movements. Gastrointestinal: Soft and nontender. Normal bowel sounds Genitourinary: Thick white vaginal discharge in clusters, normal-appearing cervix, no adnexal tenderness. Musculoskeletal: Nontender with normal range of motion in all extremities. No lower extremity tenderness nor edema. Neurologic:  Normal speech and language. No gross focal neurologic deficits are appreciated.  Skin:  Skin is warm, dry and intact. No rash noted. Psychiatric: Mood and affect are normal. Speech and behavior are normal.  ____________________________________________  ED COURSE:  Pertinent labs & imaging results that were available during my care of the patient were reviewed by me and considered in my medical decision making (see chart for details). Patient resents to ER with vaginal discharge which clinically resembles yeast ____________________________________________    LABS (pertinent positives/negatives)  Labs Reviewed  WET PREP, GENITAL  CHLAMYDIA/NGC RT PCR ([facility] ONLY)  POC URINE PREG, ED  POCT PREGNANCY, URINE   ___________________________________________  FINAL ASSESSMENT AND PLAN  Vaginal discharge  Plan: Patient with labs as dictated above. Patient clinically with vaginal yeast. She'll be given Diflucan. She is stable for outpatient follow-up with her doctor  as needed.   Emily FilbertWilliams, Navada Osterhout E, MD   Powers: This dictation was prepared with Dragon dictation. Any transcriptional errors that result from this process are unintentional   Emily FilbertJonathan E Shaila Gilchrest, MD 02/08/16 1419

## 2016-02-08 NOTE — ED Notes (Signed)
PT to ED today with 2 days of vaginal itching, swelling in the vaginal area, white thick discharge with slight odor.  Pt also reports some nausea.

## 2016-02-16 ENCOUNTER — Emergency Department
Admission: EM | Admit: 2016-02-16 | Discharge: 2016-02-16 | Disposition: A | Discharge status: Home / Self Care | Payer: Medicaid Other | Attending: Emergency Medicine | Admitting: Emergency Medicine

## 2016-02-16 DIAGNOSIS — B9689 Other specified bacterial agents as the cause of diseases classified elsewhere: Secondary | ICD-10-CM

## 2016-02-16 DIAGNOSIS — N760 Acute vaginitis: Secondary | ICD-10-CM | POA: Insufficient documentation

## 2016-02-16 DIAGNOSIS — F17210 Nicotine dependence, cigarettes, uncomplicated: Secondary | ICD-10-CM | POA: Insufficient documentation

## 2016-02-16 LAB — URINALYSIS COMPLETE WITH MICROSCOPIC (ARMC ONLY)
BILIRUBIN URINE: NEGATIVE
Bacteria, UA: NONE SEEN
Glucose, UA: NEGATIVE mg/dL
Hgb urine dipstick: NEGATIVE
KETONES UR: NEGATIVE mg/dL
Nitrite: NEGATIVE
Protein, ur: NEGATIVE mg/dL
RBC / HPF: NONE SEEN RBC/hpf (ref 0–5)
Specific Gravity, Urine: 1.014 (ref 1.005–1.030)
pH: 7 (ref 5.0–8.0)

## 2016-02-16 LAB — WET PREP, GENITAL
Clue Cells Wet Prep HPF POC: NONE SEEN
SPERM: NONE SEEN
Trich, Wet Prep: NONE SEEN
YEAST WET PREP: NONE SEEN

## 2016-02-16 LAB — CHLAMYDIA/NGC RT PCR (ARMC ONLY)
Chlamydia Tr: NOT DETECTED
N gonorrhoeae: NOT DETECTED

## 2016-02-16 LAB — POCT PREGNANCY, URINE: PREG TEST UR: NEGATIVE

## 2016-02-16 MED ORDER — FLUCONAZOLE 50 MG PO TABS
150.0000 mg | ORAL_TABLET | Freq: Every day | ORAL | Status: DC
  Administered 2016-02-16: 150 mg via ORAL
  Filled 2016-02-16: qty 1

## 2016-02-16 MED ORDER — METRONIDAZOLE 500 MG PO TABS: 500.0000 mg | ORAL_TABLET | Freq: Two times a day (BID) | ORAL | Status: AC

## 2016-02-16 NOTE — ED Notes (Signed)
Pt c/o vaginal discharge and discomfort for the past 2 days with swelling to the vaginal area..Marland Kitchen

## 2016-02-16 NOTE — ED Provider Notes (Signed)
Time Seen: Approximately 1040 I have reviewed the triage notes  Chief Complaint: Vaginal Discharge   History of Present Illness: Kimberly Powers is a 20 y.o. female *who was seen and evaluated here last week and was diagnosed with a yeast infection. She states she cannot afford the patient Diflucan prescription that she was given. Patient states her discharge is Worse and describes it as a 18 discharge is caused some swelling. She denies any fever or obvious skin lesions. Patient states she would like to be tested again for STDs. The patient denies any abdominal pain, dysuria, or hearing hematuria. She states she does have a foul odor to her discharge a "" fishy smell"  History reviewed. No pertinent past medical history.  There are no active problems to display for this patient.   History reviewed. No pertinent past surgical history.  History reviewed. No pertinent past surgical history.  Current Outpatient Rx  Name  Route  Sig  Dispense  Refill  . fluconazole (DIFLUCAN) 150 MG tablet   Oral   Take 1 tablet (150 mg total) by mouth once.   1 tablet   1   . metroNIDAZOLE (FLAGYL) 500 MG tablet   Oral   Take 1 tablet (500 mg total) by mouth 2 (two) times daily.   14 tablet   0     Allergies:  Review of patient's allergies indicates no known allergies.  Family History: No family history on file.  Social History: Social History  Substance Use Topics  . Smoking status: Current Every Day Smoker -- 0.25 packs/day    Types: Cigarettes  . Smokeless tobacco: None  . Alcohol Use: 3.6 oz/week    6 Cans of beer per week     Review of Systems:   10 point review of systems was performed and was otherwise negative:  Constitutional: No fever Eyes: No visual disturbances ENT: No sore throat, ear pain Cardiac: No chest pain Respiratory: No shortness of breath, wheezing, or stridor Abdomen: No abdominal pain, no vomiting, No diarrhea Endocrine: No weight loss, No night  sweats Extremities: No peripheral edema, cyanosis Skin: No rashes, easy bruising Neurologic: No focal weakness, trouble with speech or swollowing Urologic: No dysuria, Hematuria, or urinary frequency No joint pain or swelling  Physical Exam:  ED Triage Vitals  Enc Vitals Group     BP 02/16/16 1009 106/69 mmHg     Pulse Rate 02/16/16 1009 63     Resp 02/16/16 1009 16     Temp 02/16/16 1009 97.7 F (36.5 C)     Temp Source 02/16/16 1009 Oral     SpO2 02/16/16 1009 100 %     Weight 02/16/16 1009 105 lb (47.628 kg)     Height 02/16/16 1009 5\' 3"  (1.6 m)     Head Cir --      Peak Flow --      Pain Score 02/16/16 1010 10     Pain Loc --      Pain Edu? --      Excl. in GC? --     General: Awake , Alert , and Oriented times 3; GCS 15 Head: Normal cephalic , atraumatic Eyes: Pupils equal , round, reactive to light Nose/Throat: No nasal drainage, patent upper airway without erythema or exudate.  Neck: Supple, Full range of motion, No anterior adenopathy or palpable thyroid masses Lungs: Clear to ascultation without wheezes , rhonchi, or rales Heart: Regular rate, regular rhythm without murmurs , gallops , or  rubs Abdomen: Soft, non tender without rebound, guarding , or rigidity; bowel sounds positive and symmetric in all 4 quadrants. No organomegaly .        Extremities: 2 plus symmetric pulses. No edema, clubbing or cyanosis Neurologic: normal ambulation, Motor symmetric without deficits, sensory intact Skin: warm, dry, no rashes Pelvic exam with chaperone present shows a foamy white discharge. The cervix appears within normal limits. She has no cervical motion or adnexal tenderness. GC, chlamydia, wet prep were obtained.  Labs:   All laboratory work was reviewed including any pertinent negatives or positives listed below:  Labs Reviewed  WET PREP, GENITAL - Abnormal; Notable for the following:    WBC, Wet Prep HPF POC FEW (*)    All other components within normal limits   URINALYSIS COMPLETEWITH MICROSCOPIC ([facility] ONLY) - Abnormal; Notable for the following:    Color, Urine YELLOW (*)    APPearance CLEAR (*)    Leukocytes, UA TRACE (*)    Squamous Epithelial / LPF 0-5 (*)    All other components within normal limits  CHLAMYDIA/NGC RT PCR ([facility] ONLY)  POCT PREGNANCY, URINE  POC URINE PREG, ED  Results were negative on the chlamydia and gonorrhea testing.   ED Course: * The patient's stay here was uneventful and I'm not sure exactly the nature of her discharge at this time. We gave her Diflucan here in emergency department due to the pruritic nature. She does not appear to have any external lesions. I did not see see any obvious syphilis or herpes zoster, etc. Patient's discharge has some white blood cells in it but was negative for clue cells, etc. Since her discharge as been relatively persistent and I felt she would benefit for treatment of bacterial vaginosis with a one-week course of Flagyl. Her clue cells aren't negative here, however her description and the appearance of her discharge appears to be consistent with bacterial vaginosis She's been referred again to OB/GYN unassigned.   Assessment: * Bacterial vaginosis   Final Clinical Impression: *  Final diagnoses:  Bacterial vaginosis     Plan:  Outpatient Flagyl Patient was advised to return immediately if condition worsens. Patient was advised to follow up with their primary care physician or other specialized physicians involved in their outpatient care. The patient and/or family member/power of attorney had laboratory results reviewed at the bedside. All questions and concerns were addressed and appropriate discharge instructions were distributed by the nursing staff.           Jennye MoccasinBrian S Aliviana Burdell, MD 02/16/16 [phone]

## 2016-02-16 NOTE — Discharge Instructions (Signed)
Bacterial Vaginosis Bacterial vaginosis is a vaginal infection that occurs when the normal balance of bacteria in the vagina is disrupted. It results from an overgrowth of certain bacteria. This is the most common vaginal infection in women of childbearing age. Treatment is important to prevent complications, especially in pregnant women, as it can cause a premature delivery. CAUSES  Bacterial vaginosis is caused by an increase in harmful bacteria that are normally present in smaller amounts in the vagina. Several different kinds of bacteria can cause bacterial vaginosis. However, the reason that the condition develops is not fully understood. RISK FACTORS Certain activities or behaviors can put you at an increased risk of developing bacterial vaginosis, including:  Having a new sex partner or multiple sex partners.  Douching.  Using an intrauterine device (IUD) for contraception. Women do not get bacterial vaginosis from toilet seats, bedding, swimming pools, or contact with objects around them. SIGNS AND SYMPTOMS  Some women with bacterial vaginosis have no signs or symptoms. Common symptoms include:  Grey vaginal discharge.  A fishlike odor with discharge, especially after sexual intercourse.  Itching or burning of the vagina and vulva.  Burning or pain with urination. DIAGNOSIS  Your health care provider will take a medical history and examine the vagina for signs of bacterial vaginosis. A sample of vaginal fluid may be taken. Your health care provider will look at this sample under a microscope to check for bacteria and abnormal cells. A vaginal pH test may also be done.  TREATMENT  Bacterial vaginosis may be treated with antibiotic medicines. These may be given in the form of a pill or a vaginal cream. A second round of antibiotics may be prescribed if the condition comes back after treatment. Because bacterial vaginosis increases your risk for sexually transmitted diseases, getting  treated can help reduce your risk for chlamydia, gonorrhea, HIV, and herpes. HOME CARE INSTRUCTIONS   Only take over-the-counter or prescription medicines as directed by your health care provider.  If antibiotic medicine was prescribed, take it as directed. Make sure you finish it even if you start to feel better.  Tell all sexual partners that you have a vaginal infection. They should see their health care provider and be treated if they have problems, such as a mild rash or itching.  During treatment, it is important that you follow these instructions:  Avoid sexual activity or use condoms correctly.  Do not douche.  Avoid alcohol as directed by your health care provider.  Avoid breastfeeding as directed by your health care provider. SEEK MEDICAL CARE IF:   Your symptoms are not improving after 3 days of treatment.  You have increased discharge or pain.  You have a fever. MAKE SURE YOU:   Understand these instructions.  Will watch your condition.  Will get help right away if you are not doing well or get worse. FOR MORE INFORMATION  Centers for Disease Control and Prevention, Division of STD Prevention: SolutionApps.co.zawww.cdc.gov/std American Sexual Health Association (ASHA): www.ashastd.org    This information is not intended to replace advice given to you by your health care provider. Make sure you discuss any questions you have with your health care provider.   Document Released: 07/31/2005 Document Revised: 08/21/2014 Document Reviewed: 03/12/2013 Elsevier Interactive Patient Education [employer]2016 Elsevier Inc.  Please return immediately if condition worsens. Please contact her primary physician or the physician you were given for referral. If you have any specialist physicians involved in her treatment and plan please also contact them. Thank  you for using [facility].

## 2016-04-11 ENCOUNTER — Encounter: Payer: Self-pay | Admitting: Emergency Medicine

## 2016-04-11 ENCOUNTER — Emergency Department
Admission: EM | Admit: 2016-04-11 | Discharge: 2016-04-11 | Disposition: A | Discharge status: Home / Self Care | Payer: Medicaid Other | Attending: Emergency Medicine | Admitting: Emergency Medicine

## 2016-04-11 ENCOUNTER — Emergency Department: Payer: Medicaid Other

## 2016-04-11 DIAGNOSIS — R109 Unspecified abdominal pain: Secondary | ICD-10-CM

## 2016-04-11 DIAGNOSIS — O99511 Diseases of the respiratory system complicating pregnancy, first trimester: Secondary | ICD-10-CM | POA: Insufficient documentation

## 2016-04-11 DIAGNOSIS — Z3490 Encounter for supervision of normal pregnancy, unspecified, unspecified trimester: Secondary | ICD-10-CM

## 2016-04-11 DIAGNOSIS — Z3A08 8 weeks gestation of pregnancy: Secondary | ICD-10-CM | POA: Insufficient documentation

## 2016-04-11 DIAGNOSIS — M545 Low back pain: Secondary | ICD-10-CM | POA: Insufficient documentation

## 2016-04-11 DIAGNOSIS — O99331 Smoking (tobacco) complicating pregnancy, first trimester: Secondary | ICD-10-CM | POA: Insufficient documentation

## 2016-04-11 DIAGNOSIS — F17210 Nicotine dependence, cigarettes, uncomplicated: Secondary | ICD-10-CM | POA: Insufficient documentation

## 2016-04-11 DIAGNOSIS — J029 Acute pharyngitis, unspecified: Secondary | ICD-10-CM | POA: Insufficient documentation

## 2016-04-11 DIAGNOSIS — N898 Other specified noninflammatory disorders of vagina: Secondary | ICD-10-CM | POA: Insufficient documentation

## 2016-04-11 DIAGNOSIS — R1030 Lower abdominal pain, unspecified: Secondary | ICD-10-CM | POA: Insufficient documentation

## 2016-04-11 LAB — WET PREP, GENITAL
CLUE CELLS WET PREP: NONE SEEN
Sperm: NONE SEEN
Trich, Wet Prep: NONE SEEN
Yeast Wet Prep HPF POC: NONE SEEN

## 2016-04-11 LAB — URINALYSIS COMPLETE WITH MICROSCOPIC (ARMC ONLY)
BILIRUBIN URINE: NEGATIVE
Bacteria, UA: NONE SEEN
Glucose, UA: NEGATIVE mg/dL
Hgb urine dipstick: NEGATIVE
KETONES UR: NEGATIVE mg/dL
Leukocytes, UA: NEGATIVE
Nitrite: NEGATIVE
PROTEIN: NEGATIVE mg/dL
RBC / HPF: NONE SEEN RBC/hpf (ref 0–5)
Specific Gravity, Urine: 1.021 (ref 1.005–1.030)
pH: 7 (ref 5.0–8.0)

## 2016-04-11 LAB — CHLAMYDIA/NGC RT PCR (ARMC ONLY)
CHLAMYDIA TR: NOT DETECTED
N gonorrhoeae: NOT DETECTED

## 2016-04-11 LAB — POCT PREGNANCY, URINE
PREG TEST UR: POSITIVE — AB
Preg Test, Ur: NEGATIVE

## 2016-04-11 LAB — POCT RAPID STREP A: Streptococcus, Group A Screen (Direct): NEGATIVE

## 2016-04-11 LAB — HCG, QUANTITATIVE, PREGNANCY: hCG, Beta Chain, Quant, S: 60 m[IU]/mL — ABNORMAL HIGH (ref <5)

## 2016-04-11 NOTE — ED Notes (Signed)
Pt c/o sore throat and increased vaginal discharge and itching. Pt denies any fevers or n/v. Denies any dysuria.

## 2016-04-11 NOTE — ED Provider Notes (Signed)
[facility] Emergency Department Provider Note  ____________________________________________  Time seen: Approximately 2:22 PM  I have reviewed the triage vital signs and the nursing notes.   HISTORY  Chief Complaint Sore Throat and Vaginal Discharge    HPI Kimberly Powers is a 20 y.o. female , NAD, presents to the emergency with several hour history of sore throat and body aches as well as vaginal discharge 2 weeks. Patient states over the last couple of weeks she has had a thick, white discharge without bleeding or pain. Felt that it may be normal post menses discharge but noted a "fishy odor"over the last 2 days. Has had some lower abdominal cramping as well as right lower back pain. Denies any dysuria, hematuria, increased urinary frequency, urinary hesitancy or urgency. Is sexually active with 1 female partner over the last 6 months. Was screened for STI was earlier in the year and was negative. 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 Last menstrual period was 03/19/2016. Also notes onset of sore throat and body aches this morning. No nasal congestion, runny nose, ear pain, sinus pressure, cough, chest congestion. Denies chest pain, shortness breath, wheezing. Has not taken anything over-the-counter for her symptoms at this time.   History reviewed. No pertinent past medical history.  There are no active problems to display for this patient.   History reviewed. No pertinent surgical history.  Prior to Admission medications   Medication Sig Start Date End Date Taking? Authorizing Provider  fluconazole (DIFLUCAN) 150 MG tablet Take 1 tablet (150 mg total) by mouth once. 02/08/16   Emily Filbert, MD    Allergies Review of patient's allergies indicates no known allergies.  No family history on file.  Social History Social History  Substance Use Topics  . Smoking status: Current Some Day Smoker     Packs/day: 0.25    Types: Cigarettes  . Smokeless tobacco: Never Used  . Alcohol use 3.6 oz/week    6 Cans of beer per week     Review of Systems  Constitutional: No fever/chills, fatigue Eyes: No visual changes. No discharge ENT: Positive sore throat. No nasal congestion, ear pain, sinus pressure Cardiovascular: No chest pain. Respiratory: No cough. No shortness of breath. No wheezing.  Gastrointestinal: Positive lower abdominal pressure.  No nausea, vomiting.  No diarrhea.  No constipation. Genitourinary: Positive vaginal discharge with odor. Negative for dysuria, hematuria. No urinary hesitancy, urgency or increased frequency. Musculoskeletal: Positive for back pain.  Skin: Negative for rash, redness, skin sores, swelling. Neurological: Negative for headaches, focal weakness or numbness. No tingling 10-point ROS otherwise negative.  ____________________________________________   PHYSICAL EXAM:  VITAL SIGNS: ED Triage Vitals  Enc Vitals Group     BP 04/11/16 1406 111/68     Pulse Rate 04/11/16 1406 61     Resp 04/11/16 1405 15     Temp 04/11/16 1406 98 F (36.7 C)     Temp Source 04/11/16 1405 Oral     SpO2 04/11/16 1405 100 %     Weight 04/11/16 1405 109 lb (49.4 kg)     Height 04/11/16 1405 5\' 3"  (1.6 m)     Head Circumference --      Peak Flow --      Pain Score 04/11/16 1409 6     Pain Loc --      Pain Edu? --      Excl. in GC? --  Physical exam completed in the presence of [place] Loye, RN  Constitutional: Alert and oriented. Well appearing and in no acute distress. Eyes: Conjunctivae are normal without icterus or injection Head: Atraumatic. ENT:      Ears: TMs visualized bilaterally without erythema, effusion, bulging, perforation.      Nose: No congestion/rhinnorhea.      Mouth/Throat: Mucous membranes are moist. Pharynx without erythema, swelling, exudate. Clear postnasal drip. Neck: Supple with full range of  motion. Hematological/Lymphatic/Immunilogical: No cervical lymphadenopathy. Cardiovascular: Normal rate, regular rhythm. Normal S1 and S2.  Good peripheral circulation. Respiratory: Normal respiratory effort without tachypnea or retractions. Lungs CTAB with breath sounds noted in all lung fields. No wheeze, rhonchi, rales. Gastrointestinal: Soft and nontender without distention in all quadrants. Bowel sounds grossly normal active in all quadrants. No CVA tenderness. Genitourinary:  External genitalia without abnormality or sores. No tenderness to palpation. Vaginal vault without erythema or swelling. White/clear, thick discharge noted about the vaginal vault. Cervical os is closed.  No CMT. Musculoskeletal: No lower extremity tenderness nor edema.  No joint effusions. Neurologic:  Normal speech and language. No gross focal neurologic deficits are appreciated.  Skin:  Skin is warm, dry and intact. No rash noted. Psychiatric: Mood and affect are normal. Speech and behavior are normal. Patient exhibits appropriate insight and judgement.   ____________________________________________   LABS (all labs ordered are listed, but only abnormal results are displayed)  Labs Reviewed  WET PREP, GENITAL - Abnormal; Notable for the following:       Result Value   WBC, Wet Prep HPF POC MODERATE (*)    All other components within normal limits  URINALYSIS COMPLETEWITH MICROSCOPIC ([facility] ONLY) - Abnormal; Notable for the following:    Color, Urine YELLOW (*)    APPearance CLEAR (*)    Squamous Epithelial / LPF 0-5 (*)    All other components within normal limits  POCT PREGNANCY, URINE - Abnormal; Notable for the following:    Preg Test, Ur POSITIVE (*)    All other components within normal limits  CHLAMYDIA/NGC RT PCR ([facility] ONLY)  HCG, QUANTITATIVE, PREGNANCY  POC URINE PREG, ED  POCT PREGNANCY, URINE  POCT RAPID STREP A  POC URINE PREG, ED    ____________________________________________  EKG  None ____________________________________________  RADIOLOGY I have personally viewed and evaluated these images (plain radiographs) as part of my medical decision making, as well as reviewing the written report by the radiologist.  No results found.  ____________________________________________    PROCEDURES  Procedure(s) performed: None   Procedures   Medications - No data to display   ____________________________________________   INITIAL IMPRESSION / ASSESSMENT AND PLAN / ED COURSE  Pertinent labs & imaging results that were available during my care of the patient were reviewed by me and considered in my medical decision making (see chart for details).  Clinical Course  Comment By Time  Patient has been notified of positive urine pregnancy test as well as the wet prep being negative for yeast, trichomoniasis and BV. Patient notes that her period on 03/19/2016 was lighter and shorter in duration per her usual periods. Patient is in agreement to complete quantitative hCG as well as transvaginal ultrasound considering she has had abdominal pain as well as back pain. Hope PigeonJami L Avrian Delfavero, PA-C 08/29 1543  We are still waiting on hCG results for the patient to be taken to ultrasound. Patient will be [facility] out [facility] Triplett, NP, who will continue the patient's care to discharge.  Hope Pigeon, PA-C 08/29 1644          Tito Ausmus Jon Gills, PA-C 04/11/16 1646    Gayla Doss, MD 04/20/16 [phone]

## 2016-04-11 NOTE — ED Notes (Signed)
The pregnancy test was positive.

## 2016-04-11 NOTE — ED Provider Notes (Signed)
-----------------------------------------   5:48 PM on 04/11/2016 -----------------------------------------   Blood pressure 111/68, pulse 61, temperature 98 F (36.7 C), temperature source Oral, resp. rate 15, height 5\' 3"  (1.6 m), weight 49.4 kg, last menstrual period 03/19/2016, SpO2 100 %.  Assuming care from J. Hagler, PA-C.  In short, Kimberly CallasLacorya M Kimberly Powers is a 20 y.o. female with a chief complaint of Sore Throat and Vaginal Discharge .  Refer to the original H&P for additional details.  The current plan of care is to discharge home for follow up with gynecology. She was advised to start prenatal vitamins. She was advised to return to the emergency department for any vaginal bleeding or abdominal pain that she is unable to see the gynecologist.    Chinita Pesterari B Cullin Dishman, FNP 04/11/16 2022    Gayla DossEryka A Gayle, MD 04/20/16 [phone]

## 2016-04-11 NOTE — ED Triage Notes (Signed)
C/O sore throat and body aches since this morning.  Also c/o vaginal discharge x 2 weeks, started to have an odor 2 days ago.

## 2016-05-01 ENCOUNTER — Encounter: Payer: Self-pay | Admitting: Emergency Medicine

## 2016-05-01 DIAGNOSIS — O23591 Infection of other part of genital tract in pregnancy, first trimester: Secondary | ICD-10-CM | POA: Insufficient documentation

## 2016-05-01 DIAGNOSIS — O99331 Smoking (tobacco) complicating pregnancy, first trimester: Secondary | ICD-10-CM | POA: Diagnosis not present

## 2016-05-01 DIAGNOSIS — Z3A01 Less than 8 weeks gestation of pregnancy: Secondary | ICD-10-CM | POA: Insufficient documentation

## 2016-05-01 DIAGNOSIS — F17210 Nicotine dependence, cigarettes, uncomplicated: Secondary | ICD-10-CM | POA: Insufficient documentation

## 2016-05-01 NOTE — ED Triage Notes (Signed)
Pt presents to ED with worsening vaginal discharge with an foul odor and lower abd cramping. Pt states she is concerned she has an STD. Currently 2 months pregnant. No prenatal care at this time. Seen in this ED and treated for a yeast infection several weeks ago and pt states her symptoms never went away and have now gotten more severe. Her "sexual partner said today it would be best to go get checked for an STD".

## 2016-05-02 ENCOUNTER — Emergency Department
Admission: EM | Admit: 2016-05-02 | Discharge: 2016-05-02 | Disposition: A | Discharge status: Home / Self Care | Payer: Medicaid Other | Attending: Emergency Medicine | Admitting: Emergency Medicine

## 2016-05-02 ENCOUNTER — Emergency Department: Payer: Medicaid Other

## 2016-05-02 DIAGNOSIS — Z3490 Encounter for supervision of normal pregnancy, unspecified, unspecified trimester: Secondary | ICD-10-CM

## 2016-05-02 DIAGNOSIS — N760 Acute vaginitis: Secondary | ICD-10-CM

## 2016-05-02 DIAGNOSIS — O26891 Other specified pregnancy related conditions, first trimester: Secondary | ICD-10-CM

## 2016-05-02 DIAGNOSIS — N898 Other specified noninflammatory disorders of vagina: Secondary | ICD-10-CM

## 2016-05-02 DIAGNOSIS — R102 Pelvic and perineal pain: Secondary | ICD-10-CM

## 2016-05-02 DIAGNOSIS — B9689 Other specified bacterial agents as the cause of diseases classified elsewhere: Secondary | ICD-10-CM

## 2016-05-02 LAB — URINALYSIS COMPLETE WITH MICROSCOPIC (ARMC ONLY)
BILIRUBIN URINE: NEGATIVE
Bacteria, UA: NONE SEEN
GLUCOSE, UA: NEGATIVE mg/dL
HGB URINE DIPSTICK: NEGATIVE
KETONES UR: NEGATIVE mg/dL
LEUKOCYTES UA: NEGATIVE
NITRITE: NEGATIVE
Protein, ur: NEGATIVE mg/dL
RBC / HPF: NONE SEEN RBC/hpf (ref 0–5)
SPECIFIC GRAVITY, URINE: 1.02 (ref 1.005–1.030)
pH: 6 (ref 5.0–8.0)

## 2016-05-02 LAB — CHLAMYDIA/NGC RT PCR (ARMC ONLY)
CHLAMYDIA TR: NOT DETECTED
N GONORRHOEAE: NOT DETECTED

## 2016-05-02 LAB — WET PREP, GENITAL
SPERM: NONE SEEN
TRICH WET PREP: NONE SEEN
WBC, Wet Prep HPF POC: NONE SEEN
Yeast Wet Prep HPF POC: NONE SEEN

## 2016-05-02 MED ORDER — METRONIDAZOLE 0.75 % VA GEL: 1.0000 | Freq: Every day | VAGINAL | 0 refills | Status: DC

## 2016-05-02 MED ORDER — METRONIDAZOLE 500 MG PO TABS
500.0000 mg | ORAL_TABLET | Freq: Once | ORAL | Status: AC
Start: 2016-05-02 — End: 2016-05-02
  Administered 2016-05-02: 500 mg via ORAL
  Filled 2016-05-02: qty 1

## 2016-05-02 MED ORDER — ACETAMINOPHEN 325 MG PO TABS
650.0000 mg | ORAL_TABLET | Freq: Once | ORAL | Status: AC
  Administered 2016-05-02: 650 mg via ORAL
  Filled 2016-05-02: qty 2

## 2016-05-02 NOTE — ED Provider Notes (Signed)
[facility] Emergency Department Provider Note   ____________________________________________   First MD Initiated Contact with Patient 05/02/16 0301     (approximate)  I have reviewed the triage vital signs and the nursing notes.   HISTORY  Chief Complaint Abdominal Pain and Vaginal Discharge    HPI Kimberly Powers is a 20 y.o. female presented to you from home with a chief complaint of vaginal discharge and pelvic cramping. Patient is G1 P0 approximately 8 months pregnant by dates. She was seen and treated for yeast infection several weeks ago. States her symptoms never totally went away and have gotten more severe. Her sexual partner contracted "something" and encouraged her to get checked for an STD. Last sexual intercourse 2 days ago. Denies vaginal bleeding. Denies recent fever, chills, chest pain, shortness of breath, nausea, vomiting, diarrhea. Denies recent travel or trauma. Nothing makes her symptoms better or worse.   Past medical history None  There are no active problems to display for this patient.   History reviewed. No pertinent surgical history.  Prior to Admission medications   Medication Sig Start Date End Date Taking? Authorizing Provider  fluconazole (DIFLUCAN) 150 MG tablet Take 1 tablet (150 mg total) by mouth once. 02/08/16   Emily Filbert, MD  metroNIDAZOLE (METROGEL VAGINAL) 0.75 % vaginal gel Place 1 Applicatorful vaginally at bedtime. X 5 nights 05/02/16   Irean Hong, MD    Allergies Review of patient's allergies indicates no known allergies.  History reviewed. No pertinent family history.  Social History Social History  Substance Use Topics  . Smoking status: Current Some Day Smoker    Packs/day: 0.25    Types: Cigarettes  . Smokeless tobacco: Never Used  . Alcohol use No    Review of Systems  Constitutional: No fever/chills. Eyes: No visual changes. ENT: No sore throat. Cardiovascular: Denies  chest pain. Respiratory: Denies shortness of breath. Gastrointestinal: Positive for pelvic cramping. No abdominal pain.  No nausea, no vomiting.  No diarrhea.  No constipation. Genitourinary: Positive for vaginal discharge. Negative for dysuria. Musculoskeletal: Negative for back pain. Skin: Negative for rash. Neurological: Negative for headaches, focal weakness or numbness.  10-point ROS otherwise negative.  ____________________________________________   PHYSICAL EXAM:  VITAL SIGNS: ED Triage Vitals [05/01/16 2318]  Enc Vitals Group     BP (!) 115/55     Pulse Rate 68     Resp 18     Temp 98.4 F (36.9 C)     Temp Source Oral     SpO2 100 %     Weight 109 lb (49.4 kg)     Height 5\' 3"  (1.6 m)     Head Circumference      Peak Flow      Pain Score 8     Pain Loc      Pain Edu?      Excl. in GC?     Constitutional: Alert and oriented. Well appearing and in no acute distress. Eyes: Conjunctivae are normal. PERRL. EOMI. Head: Atraumatic. Nose: No congestion/rhinnorhea. Mouth/Throat: Mucous membranes are moist.  Oropharynx non-erythematous. Neck: No stridor.   Cardiovascular: Normal rate, regular rhythm. Grossly normal heart sounds.  Good peripheral circulation. Respiratory: Normal respiratory effort.  No retractions. Lungs CTAB. Gastrointestinal: Soft and nontender. No distention. No abdominal bruits. No CVA tenderness. Musculoskeletal: No lower extremity tenderness nor edema.  No joint effusions. Neurologic:  Normal speech and language. No gross focal neurologic deficits are appreciated. No gait instability. Skin:  Skin is warm, dry and intact. No rash noted. Psychiatric: Mood and affect are normal. Speech and behavior are normal.  ____________________________________________   LABS (all labs ordered are listed, but only abnormal results are displayed)  Labs Reviewed  WET PREP, GENITAL - Abnormal; Notable for the following:       Result Value   Clue Cells Wet  Prep HPF POC PRESENT (*)    All other components within normal limits  URINALYSIS COMPLETEWITH MICROSCOPIC ([facility] ONLY) - Abnormal; Notable for the following:    Color, Urine YELLOW (*)    APPearance CLEAR (*)    Squamous Epithelial / LPF 0-5 (*)    All other components within normal limits  CHLAMYDIA/NGC RT PCR ([facility] ONLY)   ____________________________________________  EKG  None ____________________________________________  RADIOLOGY  OB ultrasound interpreted per Dr. Andria MeuseStevens: Single intrauterine pregnancy. Estimated gestational age by  crown-rump length is 6 weeks 4 days. No acute complications  demonstrated sonographically.   ____________________________________________   PROCEDURES  Procedure(s) performed:   Pelvic exam: External exam WNL without rashes, lesions or vesicles. Speculum exam reveals foul-smelling white discharge. No vaginal bleeding. Cervical os closed. Bimanual exam WNL.  Procedures  Critical Care performed: No  ____________________________________________   INITIAL IMPRESSION / ASSESSMENT AND PLAN / ED COURSE  Pertinent labs & imaging results that were available during my care of the patient were reviewed by me and considered in my medical decision making (see chart for details).  20 year old female G1 P0 approximately [redacted] weeks pregnant who presents with foul-smelling vaginal discharge and concern for STD as well as pelvic cramping. Will obtain urinalysis, wet prep, DNA specimens and sent patient for OB ultrasound.  Clinical Course  Comment By Time  Updated patient on imaging results. Will prescribe MetroGel for bacterial vaginosis. Strict return precautions given. Patient verbalizes understanding and agrees with plan of care. Irean HongJade J Sung, MD 09/19 [phone]     ____________________________________________   FINAL CLINICAL IMPRESSION(S) / ED DIAGNOSES  Final diagnoses:  Vaginal discharge  Pelvic pain in female  Bacterial vaginosis    Pregnancy      NEW MEDICATIONS STARTED DURING THIS VISIT:  Discharge Medication List as of 05/02/2016  5:29 AM    START taking these medications   Details  metroNIDAZOLE (METROGEL VAGINAL) 0.75 % vaginal gel Place 1 Applicatorful vaginally at bedtime. X 5 nights, Starting Tue 05/02/2016, Print         Note:  This document was prepared using Dragon voice recognition software and may include unintentional dictation errors.    Irean HongJade J Sung, MD 05/02/16 [phone]

## 2016-05-02 NOTE — Discharge Instructions (Signed)
1. Use MetroGel as instructed. 2. Return to the ER for worsening symptoms, persistent vomiting, vaginal bleeding or other concerns.

## 2016-05-11 ENCOUNTER — Other Ambulatory Visit: Payer: Self-pay | Admitting: Physician Assistant

## 2016-05-11 DIAGNOSIS — Z369 Encounter for antenatal screening, unspecified: Secondary | ICD-10-CM

## 2016-05-26 ENCOUNTER — Emergency Department
Admission: EM | Admit: 2016-05-26 | Discharge: 2016-05-26 | Disposition: A | Discharge status: Home / Self Care | Payer: Medicaid Other | Attending: Emergency Medicine | Admitting: Emergency Medicine

## 2016-05-26 ENCOUNTER — Encounter: Payer: Self-pay | Admitting: Emergency Medicine

## 2016-05-26 DIAGNOSIS — Z3A12 12 weeks gestation of pregnancy: Secondary | ICD-10-CM | POA: Diagnosis not present

## 2016-05-26 DIAGNOSIS — O219 Vomiting of pregnancy, unspecified: Secondary | ICD-10-CM | POA: Diagnosis present

## 2016-05-26 DIAGNOSIS — O99611 Diseases of the digestive system complicating pregnancy, first trimester: Secondary | ICD-10-CM | POA: Insufficient documentation

## 2016-05-26 DIAGNOSIS — R197 Diarrhea, unspecified: Secondary | ICD-10-CM

## 2016-05-26 DIAGNOSIS — K529 Noninfective gastroenteritis and colitis, unspecified: Secondary | ICD-10-CM | POA: Diagnosis not present

## 2016-05-26 DIAGNOSIS — O99331 Smoking (tobacco) complicating pregnancy, first trimester: Secondary | ICD-10-CM | POA: Insufficient documentation

## 2016-05-26 DIAGNOSIS — R112 Nausea with vomiting, unspecified: Secondary | ICD-10-CM

## 2016-05-26 DIAGNOSIS — F17210 Nicotine dependence, cigarettes, uncomplicated: Secondary | ICD-10-CM | POA: Diagnosis not present

## 2016-05-26 LAB — COMPREHENSIVE METABOLIC PANEL
ALBUMIN: 4.3 g/dL (ref 3.5–5.0)
ALT: 36 U/L (ref 14–54)
ANION GAP: 9 (ref 5–15)
AST: 29 U/L (ref 15–41)
Alkaline Phosphatase: 48 U/L (ref 38–126)
BUN: 11 mg/dL (ref 6–20)
CHLORIDE: 103 mmol/L (ref 101–111)
CO2: 21 mmol/L — AB (ref 22–32)
Calcium: 9.9 mg/dL (ref 8.9–10.3)
Creatinine, Ser: 0.56 mg/dL (ref 0.44–1.00)
GFR calc Af Amer: >60 mL/min (ref >60)
GFR calc non Af Amer: >60 mL/min (ref >60)
GLUCOSE: 81 mg/dL (ref 65–99)
POTASSIUM: 3.7 mmol/L (ref 3.5–5.1)
SODIUM: 133 mmol/L — AB (ref 135–145)
TOTAL PROTEIN: 7.9 g/dL (ref 6.5–8.1)
Total Bilirubin: 1.3 mg/dL — ABNORMAL HIGH (ref 0.3–1.2)

## 2016-05-26 LAB — URINALYSIS COMPLETE WITH MICROSCOPIC (ARMC ONLY)
Bilirubin Urine: NEGATIVE
Glucose, UA: NEGATIVE mg/dL
HGB URINE DIPSTICK: NEGATIVE
Leukocytes, UA: NEGATIVE
NITRITE: NEGATIVE
PROTEIN: 100 mg/dL — AB
SPECIFIC GRAVITY, URINE: 1.031 — AB (ref 1.005–1.030)
pH: 5 (ref 5.0–8.0)

## 2016-05-26 LAB — LIPASE, BLOOD: LIPASE: 30 U/L (ref 11–51)

## 2016-05-26 LAB — CBC
HEMATOCRIT: 38.2 % (ref 35.0–47.0)
HEMOGLOBIN: 13.4 g/dL (ref 12.0–16.0)
MCH: 33 pg (ref 26.0–34.0)
MCHC: 35.1 g/dL (ref 32.0–36.0)
MCV: 93.9 fL (ref 80.0–100.0)
Platelets: 150 10*3/uL (ref 150–440)
RBC: 4.06 MIL/uL (ref 3.80–5.20)
RDW: 12.2 % (ref 11.5–14.5)
WBC: 4.3 10*3/uL (ref 3.6–11.0)

## 2016-05-26 LAB — HCG, QUANTITATIVE, PREGNANCY: HCG, BETA CHAIN, QUANT, S: 235295 m[IU]/mL — AB (ref <5)

## 2016-05-26 MED ORDER — ONDANSETRON 4 MG PO TBDP: 4.0000 mg | ORAL_TABLET | Freq: Three times a day (TID) | ORAL | 0 refills | Status: DC | PRN

## 2016-05-26 MED ORDER — ONDANSETRON HCL 4 MG/2ML IJ SOLN
4.0000 mg | Freq: Once | INTRAMUSCULAR | Status: AC
  Administered 2016-05-26: 4 mg via INTRAVENOUS
  Filled 2016-05-26: qty 2

## 2016-05-26 MED ORDER — SODIUM CHLORIDE 0.9 % IV SOLN
Freq: Once | INTRAVENOUS | Status: AC
  Administered 2016-05-26: 14:00:00 via INTRAVENOUS

## 2016-05-26 NOTE — ED Notes (Signed)
Pt verbalized understanding of discharge instructions. NAD at this time. 

## 2016-05-26 NOTE — ED Provider Notes (Addendum)
[facility] Emergency Department Provider Note        Time seen: ----------------------------------------- 2:17 PM on 05/26/2016 -----------------------------------------    I have reviewed the triage vital signs and the nursing notes.   HISTORY  Chief Complaint Nausea; Emesis; and Diarrhea    HPI Kimberly Powers is a 20 y.o. female who presents to the ER for nausea, vomiting and diarrhea for 3 days. Patient states she is around [redacted] weeks pregnant, she has had abdominal discomfort due to the vomiting. She was seen here a month ago and had a normal ultrasound. She denies fevers or other complaints. Patient reports she's been unable to tolerate anything by mouth today.   History reviewed. No pertinent past medical history.  There are no active problems to display for this patient.   History reviewed. No pertinent surgical history.  Allergies Review of patient's allergies indicates no known allergies.  Social History Social History  Substance Use Topics  . Smoking status: Current Some Day Smoker    Packs/day: 0.25    Types: Cigarettes  . Smokeless tobacco: Never Used  . Alcohol use No    Review of Systems Constitutional: Negative for fever. Cardiovascular: Negative for chest pain. Respiratory: Negative for shortness of breath. Gastrointestinal: Positive for abdominal pain, vomiting and diarrhea Genitourinary: Negative for dysuria. Musculoskeletal: Negative for back pain. Skin: Negative for rash. Neurological: Negative for headaches, positive for weakness  10-point ROS otherwise negative.  ____________________________________________   PHYSICAL EXAM:  VITAL SIGNS: ED Triage Vitals  Enc Vitals Group     BP 05/26/16 1208 103/64     Pulse Rate 05/26/16 1208 63     Resp 05/26/16 1208 18     Temp 05/26/16 1208 97.7 F (36.5 C)     Temp Source 05/26/16 1208 Oral     SpO2 05/26/16 1208 100 %     Weight 05/26/16 1209 105 lb (47.6  kg)     Height 05/26/16 1209 5\' 3"  (1.6 m)     Head Circumference --      Peak Flow --      Pain Score 05/26/16 1357 7     Pain Loc --      Pain Edu? --      Excl. in GC? --     Constitutional: Alert and oriented. Well appearing and in no distress. Eyes: Conjunctivae are normal. PERRL. Normal extraocular movements. ENT   Head: Normocephalic and atraumatic.   Nose: No congestion/rhinnorhea.   Mouth/Throat: Mucous membranes are moist.   Neck: No stridor. Cardiovascular: Normal rate, regular rhythm. No murmurs, rubs, or gallops. Respiratory: Normal respiratory effort without tachypnea nor retractions. Breath sounds are clear and equal bilaterally. No wheezes/rales/rhonchi. Gastrointestinal: Soft and nontender. Normal bowel sounds Musculoskeletal: Nontender with normal range of motion in all extremities. No lower extremity tenderness nor edema. Neurologic:  Normal speech and language. No gross focal neurologic deficits are appreciated.  Skin:  Skin is warm, dry and intact. No rash noted. Psychiatric: Mood and affect are normal. Speech and behavior are normal.  ____________________________________________  ED COURSE:  Pertinent labs & imaging results that were available during my care of the patient were reviewed by me and considered in my medical decision making (see chart for details). Clinical Course  Patient presents to the ER distress, we will assess with basic labs, give IV fluids and antiemetics.  Procedures ____________________________________________   LABS (pertinent positives/negatives)  Labs Reviewed  COMPREHENSIVE METABOLIC PANEL - Abnormal; Notable for the following:  Result Value   Sodium 133 (*)    CO2 21 (*)    Total Bilirubin 1.3 (*)    All other components within normal limits  URINALYSIS COMPLETEWITH MICROSCOPIC ([facility] ONLY) - Abnormal; Notable for the following:    Color, Urine AMBER (*)    APPearance CLOUDY (*)    Ketones, ur 2+ (*)     Specific Gravity, Urine 1.031 (*)    Protein, ur 100 (*)    Bacteria, UA RARE (*)    Squamous Epithelial / LPF 6-30 (*)    All other components within normal limits  HCG, QUANTITATIVE, PREGNANCY - Abnormal; Notable for the following:    hCG, Beta Chain, Quant, S 235,295 (*)    All other components within normal limits  LIPASE, BLOOD  CBC  ____________________________________________  FINAL ASSESSMENT AND PLAN  Gastroenteritis  Plan: Patient with labs as dictated above. Patient's no acute distress, was given IV fluids as well as antiemetics. She is around [redacted] weeks pregnant. Currently feeling better. She'll be discharged with Zofran and follow up as needed.   Emily Filbert, MD   Note: This dictation was prepared with Dragon dictation. Any transcriptional errors that result from this process are unintentional    Emily Filbert, MD 05/26/16 1419    Emily Filbert, MD 05/26/16 [phone]

## 2016-05-26 NOTE — ED Triage Notes (Signed)
Pt states she is three mths pregnant with n/v/d for three days.

## 2016-05-30 ENCOUNTER — Other Ambulatory Visit: Payer: Self-pay | Admitting: Neurology

## 2016-06-15 ENCOUNTER — Other Ambulatory Visit: Payer: Medicaid Other

## 2016-06-19 ENCOUNTER — Ambulatory Visit: Payer: Medicaid Other

## 2016-06-19 ENCOUNTER — Other Ambulatory Visit: Payer: Self-pay | Admitting: Physician Assistant

## 2016-06-19 ENCOUNTER — Ambulatory Visit: Admission: RE | Admit: 2016-06-19 | Payer: Medicaid Other | Source: Ambulatory Visit

## 2016-06-19 ENCOUNTER — Ambulatory Visit
Admission: RE | Admit: 2016-06-19 | Discharge: 2016-06-19 | Disposition: A | Discharge status: Home / Self Care | Payer: Medicaid Other | Source: Ambulatory Visit | Attending: Physician Assistant | Admitting: Physician Assistant

## 2016-06-19 ENCOUNTER — Ambulatory Visit (HOSPITAL_BASED_OUTPATIENT_CLINIC_OR_DEPARTMENT_OTHER)
Admission: RE | Admit: 2016-06-19 | Discharge: 2016-06-19 | Disposition: A | Discharge status: Home / Self Care | Payer: Medicaid Other | Source: Ambulatory Visit | Attending: Maternal & Fetal Medicine | Admitting: Maternal & Fetal Medicine

## 2016-06-19 VITALS — BP 96/56 | HR 76 | Temp 98.1°F | Resp 16 | Ht 63.0 in | Wt 112.0 lb

## 2016-06-19 DIAGNOSIS — Z3A13 13 weeks gestation of pregnancy: Secondary | ICD-10-CM | POA: Insufficient documentation

## 2016-06-19 DIAGNOSIS — Z369 Encounter for antenatal screening, unspecified: Secondary | ICD-10-CM | POA: Diagnosis not present

## 2016-06-19 DIAGNOSIS — Z1379 Encounter for other screening for genetic and chromosomal anomalies: Secondary | ICD-10-CM | POA: Insufficient documentation

## 2016-06-19 DIAGNOSIS — Z3A18 18 weeks gestation of pregnancy: Secondary | ICD-10-CM

## 2016-06-19 NOTE — Progress Notes (Signed)
[facility] Length of Consultation: 30 minutes   Ms. Lin GivensJeffries  was [facility] for genetic counseling to review prenatal screening and testing options.  This note summarizes the information we discussed.    We offered the following routine screening tests for this pregnancy:  First trimester screening, which includes nuchal translucency ultrasound screen and first trimester maternal serum marker screening.  The nuchal translucency has approximately an 80% detection rate for Down syndrome and can be positive for other chromosome abnormalities as well as congenital heart defects.  When combined with a maternal serum marker screening, the detection rate is up to 90% for Down syndrome and up to 97% for trisomy 18.     Maternal serum marker screening, a blood test that measures pregnancy proteins, can provide risk assessments for Down syndrome, trisomy 18, and open neural tube defects (spina bifida, anencephaly). Because it does not directly examine the fetus, it cannot positively diagnose or rule out these problems.  Targeted ultrasound uses high frequency sound waves to create an image of the developing fetus.  An ultrasound is often recommended as a routine means of evaluating the pregnancy.  It is also used to screen for fetal anatomy problems (for example, a heart defect) that might be suggestive of a chromosomal or other abnormality.   Should these screening tests indicate an increased concern, then the following additional testing options would be offered:  The chorionic villus sampling procedure is available for first trimester chromosome analysis.  This involves the withdrawal of a small amount of chorionic villi (tissue from the developing placenta).  Risk of pregnancy loss is estimated to be approximately 1 in 200 to 1 in 100 (0.5 to 1%).  There is approximately a 1% (1 in 100) chance that the CVS chromosome results will be unclear.  Chorionic villi  cannot be tested for neural tube defects.     Amniocentesis involves the removal of a small amount of amniotic fluid from the sac surrounding the fetus with the use of a thin needle inserted through the maternal abdomen and uterus.  Ultrasound guidance is used throughout the procedure.  Fetal cells from amniotic fluid are directly evaluated and > 99.5% of chromosome problems and > 98% of open neural tube defects can be detected. This procedure is generally performed after the 15th week of pregnancy.  The main risks to this procedure include complications leading to miscarriage in less than 1 in 200 cases (0.5%).  As another option for information if the pregnancy is suspected to be an an increased chance for certain chromosome conditions, we also reviewed the availability of cell free fetal DNA testing from maternal blood to determine whether or not the baby may have either Down syndrome, trisomy 7713, or trisomy 3518.  This test utilizes a maternal blood sample and DNA sequencing technology to isolate circulating cell free fetal DNA from maternal plasma.  The fetal DNA can then be analyzed for DNA sequences that are derived from the three most common chromosomes involved in aneuploidy, chromosomes 13, 18, and 21.  If the overall amount of DNA is greater than the expected level for any of these chromosomes, aneuploidy is suspected.  While we do not consider it a replacement for invasive testing and karyotype analysis, a negative result from this testing would be reassuring, though not a guarantee of a normal chromosome complement for the baby.  An abnormal result is certainly suggestive of an abnormal chromosome complement, though we would still  recommend CVS or amniocentesis to confirm any findings from this testing.   Cystic Fibrosis and Spinal Muscular Atrophy (SMA) screening were also discussed with the patient. Both conditions are recessive, which means that both parents must be carriers in order to have a  child with the disease.  Cystic fibrosis (CF) is one of the most common genetic conditions in persons of Caucasian ancestry.  This condition occurs in approximately 1 in 2,500 Caucasian persons and results in thickened secretions in the lungs, digestive, and reproductive systems.  For a baby to be at risk for having CF, both of the parents must be carriers for this condition.  Approximately 1 in 6225 Caucasian persons is a carrier for CF.  Current carrier testing looks for the most common mutations in the gene for CF and can detect approximately 90% of carriers in the Caucasian population.  This means that the carrier screening can greatly reduce, but cannot eliminate, the chance for an individual to have a child with CF.  If an individual is found to be a carrier for CF, then carrier testing would be available for the partner. As part of [place]'s newborn screening profile, all babies born in the state of [place] will have a two-tier screening process.  Specimens are first tested to determine the concentration of immunoreactive trypsinogen (IRT).  The top 5% of specimens with the highest IRT values then undergo DNA testing using a panel of over 40 common CF mutations. SMA is a neurodegenerative disorder that leads to atrophy of skeletal muscle and overall weakness.  This condition is also more prevalent in the Caucasian population, with 1 in 40-1 in 60 persons being a carrier and 1 in 6,000-1 in 10,000 children being affected.  There are multiple forms of the disease, with some causing death in infancy to other forms with survival into adulthood.  The genetics of SMA is complex, but carrier screening can detect up to 95% of carriers in the Caucasian population.  Similar to CF, a negative result can greatly reduce, but cannot eliminate, the chance to have a child with SMA.  We obtained a detailed family history and pregnancy history.  Little information is known about the father of the baby's family  history.  The patient's family history was reported to be unremarkable for birth defects, mental retardation, recurrent pregnancy loss or known chromosome abnormalities.  Ms. Lin GivensJeffries stated that this is her first pregnancy.  She reported no complications in this pregnancy.  Prior to learning that she was pregnant, Ms. Lin GivensJeffries was smoking cigarettes and marijuana.  She stopped as soon as she learned about the pregnancy.  We reviewed that both tobacco and marijuana are known to increase the chance for low birth weight, preterm delivery and poor pregnancy outcomes and we therefore encouraged her to continue to avoid these substances for the remainder of the pregnancy.   A review of her medical records from [facility] revealed negative testing for sickle cell trait and a normal MCV.  After consideration of the options, Ms. Lin GivensJeffries elected to proceed with first trimester screening and to declined CF and SMA carrier screening.  An ultrasound was performed at the time of the visit.  The gestational age was consistent with  13 weeks.  Fetal anatomy could not be assessed due to early gestational age.  Please refer to the ultrasound report for details of that study.  Ms. Lin GivensJeffries was encouraged to call with questions or concerns.  We can be contacted at [phone].  Wilburt Finlay, MS, CGC

## 2016-06-19 NOTE — Progress Notes (Signed)
Agree with impression and plan as outlined by [facility].

## 2016-06-26 ENCOUNTER — Telehealth: Payer: Self-pay | Admitting: Obstetrics and Gynecology

## 2016-06-26 NOTE — Telephone Encounter (Signed)
Ms. Kimberly Powers  elected to undergo First Trimester screening on 06/19/2016.  To review, first trimester screening, includes nuchal translucency ultrasound screen and/or first trimester maternal serum marker screening.  The nuchal translucency has approximately an 80% detection rate for Down syndrome and can be positive for other chromosome abnormalities as well as heart defects.  When combined with a maternal serum marker screening, the detection rate is up to 90% for Down syndrome and up to 97% for trisomy 13 and 18.     The results of the First Trimester Nuchal Translucency and Biochemical Screening were within normal range.  The risk for Down syndrome is now estimated to be 1 in 1,459.  The risk for Trisomy 13/18 is less than 1 in 10,000  Should more definitive information be desired, we would offer amniocentesis.  Because we do not yet know the effectiveness of combined first and second trimester screening, we do not recommend a maternal serum screen to assess the chance for chromosome conditions.  However, if screening for neural tube defects is desired, maternal serum screening for AFP only can be performed between 15 and [redacted] weeks gestation.     [name], MS, CGC

## 2016-07-05 ENCOUNTER — Emergency Department
Admission: EM | Admit: 2016-07-05 | Discharge: 2016-07-05 | Disposition: A | Discharge status: Home / Self Care | Payer: Medicaid Other | Attending: Emergency Medicine | Admitting: Emergency Medicine

## 2016-07-05 ENCOUNTER — Encounter: Payer: Self-pay | Admitting: Emergency Medicine

## 2016-07-05 DIAGNOSIS — Z3A16 16 weeks gestation of pregnancy: Secondary | ICD-10-CM | POA: Diagnosis not present

## 2016-07-05 DIAGNOSIS — Z87891 Personal history of nicotine dependence: Secondary | ICD-10-CM | POA: Insufficient documentation

## 2016-07-05 DIAGNOSIS — Z79899 Other long term (current) drug therapy: Secondary | ICD-10-CM | POA: Insufficient documentation

## 2016-07-05 DIAGNOSIS — B9689 Other specified bacterial agents as the cause of diseases classified elsewhere: Secondary | ICD-10-CM

## 2016-07-05 DIAGNOSIS — N760 Acute vaginitis: Secondary | ICD-10-CM

## 2016-07-05 DIAGNOSIS — O23592 Infection of other part of genital tract in pregnancy, second trimester: Secondary | ICD-10-CM | POA: Insufficient documentation

## 2016-07-05 LAB — URINALYSIS COMPLETE WITH MICROSCOPIC (ARMC ONLY)
BILIRUBIN URINE: NEGATIVE
GLUCOSE, UA: NEGATIVE mg/dL
Hgb urine dipstick: NEGATIVE
NITRITE: NEGATIVE
Protein, ur: NEGATIVE mg/dL
SPECIFIC GRAVITY, URINE: 1.02 (ref 1.005–1.030)
pH: 7 (ref 5.0–8.0)

## 2016-07-05 LAB — WET PREP, GENITAL
CLUE CELLS WET PREP: NONE SEEN
SPERM: NONE SEEN
Trich, Wet Prep: NONE SEEN
WBC WET PREP: NONE SEEN
Yeast Wet Prep HPF POC: NONE SEEN

## 2016-07-05 LAB — CHLAMYDIA/NGC RT PCR (ARMC ONLY)
CHLAMYDIA TR: NOT DETECTED
N GONORRHOEAE: NOT DETECTED

## 2016-07-05 MED ORDER — METRONIDAZOLE 500 MG PO TABS
500.0000 mg | ORAL_TABLET | Freq: Two times a day (BID) | ORAL | 0 refills | Status: AC
Start: 2016-07-05 — End: 2016-07-12

## 2016-07-05 NOTE — ED Notes (Signed)
Pt alert and oriented X4, active, cooperative, pt in NAD. RR even and unlabored, color WNL.  Pt informed to return if any life threatening symptoms occur.   

## 2016-07-05 NOTE — ED Notes (Signed)
ED Provider at bedside. 

## 2016-07-05 NOTE — ED Notes (Signed)
Pt first pregnancy, 16 weeks approx. [facility] Pt comes in today due to white vaginal discharge and itching. Denies bleeding. Pt reports decrease fetal movement. This RN attempt to find FHR with doppler, no success. Denies vaginal bleeding. Pt alert and oriented X4, active, cooperative, pt in NAD. RR even and unlabored, color WNL.  Pt in gown and awaiting MD.

## 2016-07-05 NOTE — ED Provider Notes (Signed)
[facility] Emergency Department Provider Note  ____________________________________________   I have reviewed the triage vital signs and the nursing notes.   HISTORY  Chief Complaint pregnancy complication    HPI Kimberly Powers is a 20 y.o. female who feels that she has BV again. Patient has had BV several times during her pregnancy. She is actually had a vaginal discharge since August. She has been seen and evaluated for this. Patient is provided with a due date of May 11. She is partially [redacted] weeks pregnant. She has had prior ultrasounds.She is G1 P0. She has a known IUP. She denies any recent sexual contacts. She denies any pain vaginal bleeding. She is she states capable sometimes a feeling the baby move has not felt that moved today but that is not entirely atypical.       History reviewed. No pertinent past medical history.  Patient Active Problem List   Diagnosis Date Noted  . First trimester screening     History reviewed. No pertinent surgical history.  Prior to Admission medications   Medication Sig Start Date End Date Taking? Authorizing Provider  ondansetron (ZOFRAN ODT) 4 MG disintegrating tablet Take 1 tablet (4 mg total) by mouth every 8 (eight) hours as needed for nausea or vomiting. Patient not taking: Reported on 06/19/2016 05/26/16   Emily FilbertJonathan E Williams, MD  Prenatal Vit-Fe Fumarate-FA (MULTIVITAMIN-PRENATAL) 27-0.8 MG TABS tablet Take 1 tablet by mouth daily at 12 noon.    Historical Provider, MD    Allergies Patient has no known allergies.  History reviewed. No pertinent family history.  Social History Social History  Substance Use Topics  . Smoking status: Former Smoker    Packs/day: 0.25    Types: Cigarettes  . Smokeless tobacco: Never Used  . Alcohol use No    Review of Systems Constitutional: No fever/chills Eyes: No visual changes. ENT: No sore throat. No stiff neck no neck pain Cardiovascular: Denies chest  pain. Respiratory: Denies shortness of breath. Gastrointestinal:   no vomiting.  No diarrhea.  No constipation. Genitourinary: Negative for dysuria. Musculoskeletal: Negative lower extremity swelling Skin: Negative for rash. Neurological: Negative for severe headaches, focal weakness or numbness. 10-point ROS otherwise negative.  ____________________________________________   PHYSICAL EXAM:  VITAL SIGNS: ED Triage Vitals  Enc Vitals Group     BP 07/05/16 1211 (!) 100/56     Pulse Rate 07/05/16 1211 (!) 58     Resp 07/05/16 1211 16     Temp 07/05/16 1211 98 F (36.7 C)     Temp Source 07/05/16 1211 Oral     SpO2 07/05/16 1211 100 %     Weight 07/05/16 1212 110 lb (49.9 kg)     Height 07/05/16 1212 5\' 3"  (1.6 m)     Head Circumference --      Peak Flow --      Pain Score 07/05/16 1212 8     Pain Loc --      Pain Edu? --      Excl. in GC? --     Constitutional: Alert and oriented. Well appearing and in no acute distress. Eyes: Conjunctivae are normal. PERRL. EOMI. Head: Atraumatic. Nose: No congestion/rhinnorhea. Mouth/Throat: Mucous membranes are moist.  Oropharynx non-erythematous. Neck: No stridor.   Nontender with no meningismus Cardiovascular: Normal rate, regular rhythm. Grossly normal heart sounds.  Good peripheral circulation. Respiratory: Normal respiratory effort.  No retractions. Lungs CTAB. Abdominal: Soft and nontender. No distention. No guarding no rebound Back:  There  is no focal tenderness or step off.  there is no midline tenderness there are no lesions noted. there is no CVA tenderness Pelvic exam: Female nurse chaperone present, no external lesions noted, whitish vaginal discharge noted with no purulent discharge, no cervical motion tenderness, no adnexal tenderness or mass, there is no significant uterine tenderness or mass. No vaginal bleeding, bedside ultrasound shows IUP with fetal heart tones at 156-160 Musculoskeletal: No lower extremity tenderness,  no upper extremity tenderness. No joint effusions, no DVT signs strong distal pulses no edema Neurologic:  Normal speech and language. No gross focal neurologic deficits are appreciated.  Skin:  Skin is warm, dry and intact. No rash noted. Psychiatric: Mood and affect are normal. Speech and behavior are normal.  ____________________________________________   LABS (all labs ordered are listed, but only abnormal results are displayed)  Labs Reviewed  URINALYSIS COMPLETEWITH MICROSCOPIC ([facility] ONLY) - Abnormal; Notable for the following:       Result Value   Color, Urine YELLOW (*)    APPearance CLOUDY (*)    Ketones, ur TRACE (*)    Leukocytes, UA TRACE (*)    Bacteria, UA MANY (*)    Squamous Epithelial / LPF 6-30 (*)    All other components within normal limits  WET PREP, GENITAL  CHLAMYDIA/NGC RT PCR ([facility] ONLY)   ____________________________________________  EKG  I personally interpreted any EKGs ordered by me or triage  ____________________________________________  RADIOLOGY  I reviewed any imaging ordered by me or triage that were performed during my shift and, if possible, patient and/or family made aware of any abnormal findings. ____________________________________________   PROCEDURES  Procedure(s) performed: None  Procedures  Critical Care performed: None  ____________________________________________   INITIAL IMPRESSION / ASSESSMENT AND PLAN / ED COURSE  Pertinent labs & imaging results that were available during my care of the patient were reviewed by me and considered in my medical decision making (see chart for details).  Pertinent patient here BV symptoms. Nothing at this time indicate the patient has an STI. We will treat her with Flagyl. No evidence of Trichomonas. No evidence of yeast. Patient has good fetal heart tones and good fetal motion. She is very early in her first pregnancy and is not expected to reliably feel the fetus at 16  weeks. I have a explained this to her.  Clinical Course    ____________________________________________   FINAL CLINICAL IMPRESSION(S) / ED DIAGNOSES  Final diagnoses:  None      This chart was dictated using voice recognition software.  Despite best efforts to proofread,  errors can occur which can change meaning.      Jeanmarie PlantJames A Dally Oshel, MD 07/05/16 [phone]

## 2016-07-05 NOTE — ED Triage Notes (Signed)
Pt reports has been treated for infection her whole pregnancy.  She has had 3 abx but cannot tell me what infections she had. Reports [redacted] wks pregnant and baby is not moving any more. C/o vaginal discharge but not bleeding. Itching to vaginal area. Denies dysuria.

## 2016-07-05 NOTE — ED Notes (Signed)
FHT 168 by doppler

## 2016-07-05 NOTE — ED Notes (Signed)
EDP to find FHR in lower abdomen under umbilicus. HR approx 160bmp regular.

## 2016-07-21 ENCOUNTER — Encounter: Payer: Self-pay | Admitting: *Deleted

## 2016-07-21 ENCOUNTER — Observation Stay
Admission: EM | Admit: 2016-07-21 | Discharge: 2016-07-21 | Disposition: A | Discharge status: Home / Self Care | Payer: Medicaid Other | Attending: Obstetrics and Gynecology | Admitting: Obstetrics and Gynecology

## 2016-07-21 DIAGNOSIS — M545 Low back pain: Secondary | ICD-10-CM | POA: Diagnosis present

## 2016-07-21 DIAGNOSIS — N898 Other specified noninflammatory disorders of vagina: Secondary | ICD-10-CM | POA: Insufficient documentation

## 2016-07-21 DIAGNOSIS — Z3A18 18 weeks gestation of pregnancy: Secondary | ICD-10-CM | POA: Insufficient documentation

## 2016-07-21 DIAGNOSIS — O26892 Other specified pregnancy related conditions, second trimester: Principal | ICD-10-CM | POA: Insufficient documentation

## 2016-07-21 LAB — URINALYSIS, ROUTINE W REFLEX MICROSCOPIC
Bilirubin Urine: NEGATIVE
Glucose, UA: NEGATIVE mg/dL
HGB URINE DIPSTICK: NEGATIVE
Ketones, ur: NEGATIVE mg/dL
Leukocytes, UA: NEGATIVE
Nitrite: NEGATIVE
PH: 6 (ref 5.0–8.0)
Protein, ur: NEGATIVE mg/dL
SPECIFIC GRAVITY, URINE: 1.025 (ref 1.005–1.030)

## 2016-07-21 MED ORDER — PRENATAL MULTIVITAMIN CH: 1.0000 | ORAL_TABLET | Freq: Every day | ORAL | Status: DC

## 2016-07-21 NOTE — Final Progress Note (Signed)
Physician Final Progress Note  Patient ID: Kimberly Powers MRN: [id number] DOB/AGE: 1995/08/31 20 y.o.  Admit date: 07/21/2016 Admitting provider: Vena AustriaAndreas Evalie Hargraves, MD Discharge date: 07/21/2016   Admission Diagnoses: Multiple complaints  Discharge Diagnoses:  Active Problems:   Labor and delivery indication for care or intervention  20 yo G1 at 6281w0d presenting with lower back pain, vaginal discharge which has been present for the duration of her pregnancy, and wanting to know the fetal gender.   Positive FHT's patient otherwise doing well.  Supportive measures for lumbago discussed.  Urine looked clean will contact patient if UA consistent with UTI  Consults: None  Significant Findings/ Diagnostic Studies: UA pending  Procedures: none  Discharge Condition: good  Disposition: 01-Home or Self Care  Diet: Regular diet  Discharge Activity: Activity as tolerated  Discharge Instructions    Discharge activity:  No Restrictions    Complete by:  As directed    Discharge diet:  No restrictions    Complete by:  As directed    No sexual activity restrictions    Complete by:  As directed    Notify physician for a general feeling that "something is not right"    Complete by:  As directed    Notify physician for increase or change in vaginal discharge    Complete by:  As directed    Notify physician for intestinal cramps, with or without diarrhea, sometimes described as "gas pain"    Complete by:  As directed    Notify physician for leaking of fluid    Complete by:  As directed    Notify physician for low, dull backache, unrelieved by heat or Tylenol    Complete by:  As directed    Notify physician for menstrual like cramps    Complete by:  As directed    Notify physician for pelvic pressure    Complete by:  As directed    Notify physician for uterine contractions.  These may be painless and feel like the uterus is tightening or the baby is  "balling up"    Complete by:  As  directed    Notify physician for vaginal bleeding    Complete by:  As directed    PRETERM LABOR:  Includes any of the follwing symptoms that occur between 20 - [redacted] weeks gestation.  If these symptoms are not stopped, preterm labor can result in preterm delivery, placing your baby at risk    Complete by:  As directed        Medication List    TAKE these medications   multivitamin-prenatal 27-0.8 MG Tabs tablet Take 1 tablet by mouth daily at 12 noon.   ondansetron 4 MG disintegrating tablet Commonly known as:  ZOFRAN ODT Take 1 tablet (4 mg total) by mouth every 8 (eight) hours as needed for nausea or vomiting.        Total time spent taking care of this patient: 15 minutes patient triaged remotely by phone  Signed: Vena AustriaSTAEBLER, Ziad Maye M 07/21/2016, 7:29 PM

## 2016-07-21 NOTE — Discharge Summary (Signed)
See final progress note. 

## 2016-07-21 NOTE — OB Triage Note (Signed)
Recvd pt from ED. C/o lower back pain. States she wants to know the sex of her baby now. Doppler was 158 and maternal viral signs are stable.

## 2016-07-24 ENCOUNTER — Ambulatory Visit
Admission: RE | Admit: 2016-07-24 | Discharge: 2016-07-24 | Disposition: A | Discharge status: Home / Self Care | Payer: Medicaid Other | Source: Ambulatory Visit | Attending: Obstetrics & Gynecology | Admitting: Obstetrics & Gynecology

## 2016-07-24 DIAGNOSIS — Z3A18 18 weeks gestation of pregnancy: Secondary | ICD-10-CM | POA: Insufficient documentation

## 2016-07-24 DIAGNOSIS — Z3689 Encounter for other specified antenatal screening: Secondary | ICD-10-CM | POA: Diagnosis present

## 2016-07-27 NOTE — Progress Notes (Signed)
History reviewed.  I agree with [facility].

## 2016-08-04 LAB — OB RESULTS CONSOLE RPR: RPR: NONREACTIVE

## 2016-08-04 LAB — OB RESULTS CONSOLE RUBELLA ANTIBODY, IGM: Rubella: IMMUNE

## 2016-08-04 LAB — OB RESULTS CONSOLE VARICELLA ZOSTER ANTIBODY, IGG: Varicella: IMMUNE

## 2016-08-04 LAB — OB RESULTS CONSOLE HEPATITIS B SURFACE ANTIGEN: Hepatitis B Surface Ag: NEGATIVE

## 2016-08-04 LAB — OB RESULTS CONSOLE HIV ANTIBODY (ROUTINE TESTING): HIV: NONREACTIVE

## 2016-08-09 ENCOUNTER — Other Ambulatory Visit: Payer: Self-pay | Admitting: Primary Care

## 2016-08-09 DIAGNOSIS — Z331 Pregnant state, incidental: Secondary | ICD-10-CM

## 2016-08-14 NOTE — L&D Delivery Note (Signed)
Operative Delivery Note At  a viable female was delivered at 1204 on 12/13/16 via KIWi outlet vacuum .  Presentation:VTX  vertex; Position: Left,, Occiput,, Anterior; Station: +5.  Verbal consent: obtained from patient.  Risks and benefits discussed in detail.  Risks include, but are not limited to the risks of anesthesia, bleeding, infection, damage to maternal tissues, fetal cephalhematoma.  There is also the risk of inability to effect vaginal delivery of the head, or shoulder dystocia that cannot be resolved by established maneuvers, leading to the need for emergency cesarean section.  APGAR: 8/9, ; weight  .   Placenta status: , . Intact   Cord: 3 v , delayed clamping  with the following complications: .none   Cord pH: not done Several late decels note and with pushing mother brought the fetus to outlet with caput visible through the labia Application of kiwi on occiput and on first set of pushes delivered head over an intact perineum . Mcroberts aided in delivery of the shoulders and body . Vigorous female placed on adbomen and  Cord was clamped 60 second after   Anesthesia:  cle Instruments: kiwi bell Episiotomy:none   Lacerations:  first degree left labia Suture Repair: 3.0 vicryl one figure of eight placed  Est. Blood Loss (mL):  200cc  Mom to postpartum.  Baby to Couplet care / Skin to Skin.  Ihor Austin Schermerhorn 12/13/2016, 12:21 PM

## 2016-08-25 ENCOUNTER — Emergency Department
Admission: EM | Admit: 2016-08-25 | Discharge: 2016-08-25 | Disposition: A | Discharge status: Home / Self Care | Payer: Medicaid Other | Attending: Emergency Medicine | Admitting: Emergency Medicine

## 2016-08-25 ENCOUNTER — Encounter: Payer: Self-pay | Admitting: Emergency Medicine

## 2016-08-25 ENCOUNTER — Emergency Department: Payer: Medicaid Other

## 2016-08-25 DIAGNOSIS — M79604 Pain in right leg: Secondary | ICD-10-CM | POA: Diagnosis not present

## 2016-08-25 DIAGNOSIS — M549 Dorsalgia, unspecified: Secondary | ICD-10-CM | POA: Diagnosis not present

## 2016-08-25 DIAGNOSIS — O26892 Other specified pregnancy related conditions, second trimester: Secondary | ICD-10-CM | POA: Insufficient documentation

## 2016-08-25 DIAGNOSIS — F17210 Nicotine dependence, cigarettes, uncomplicated: Secondary | ICD-10-CM | POA: Diagnosis not present

## 2016-08-25 DIAGNOSIS — Z3A20 20 weeks gestation of pregnancy: Secondary | ICD-10-CM | POA: Diagnosis not present

## 2016-08-25 DIAGNOSIS — O99332 Smoking (tobacco) complicating pregnancy, second trimester: Secondary | ICD-10-CM | POA: Diagnosis not present

## 2016-08-25 MED ORDER — ACETAMINOPHEN 500 MG PO TABS: 500.0000 mg | ORAL_TABLET | ORAL | 0 refills | Status: DC | PRN

## 2016-08-25 MED ORDER — ACETAMINOPHEN 325 MG PO TABS
650.0000 mg | ORAL_TABLET | Freq: Once | ORAL | Status: AC
  Administered 2016-08-25: 650 mg via ORAL
  Filled 2016-08-25: qty 2

## 2016-08-25 NOTE — ED Provider Notes (Signed)
[facility] Emergency Department Provider Note  ____________________________________________  Time seen: Approximately 2:06 PM  I have reviewed the triage vital signs and the nursing notes.   HISTORY  Chief Complaint Leg Pain    HPI Kimberly Powers is a 21 y.o. female , NAD, presents to the emergency department with several hour history of right leg pain. Patient states she woke this morning with severe pain in her right leg. Denies any injury, traumas or falls. Went to sleep last night without pain. Notes that she has off and on back pain which she relates to her pregnancy but otherwise has had a normally progressing pregnancy. Denies any abdominal pain, pelvic pain, vaginal bleeding or vaginal discharge. Has had no chest pain, shortness of breath, visual changes or headaches. Denies numbness, weakness, tingling, saddle paresthesias or loss of bowel or bladder control. Has not noted any rashes, redness, swelling or abnormal warmth of the extremities. Has not taken anything for her pain.   History reviewed. No pertinent past medical history.  Patient Active Problem List   Diagnosis Date Noted  . Labor and delivery indication for care or intervention 07/21/2016  . First trimester screening     History reviewed. No pertinent surgical history.  Prior to Admission medications   Medication Sig Start Date End Date Taking? Authorizing Provider  acetaminophen (TYLENOL) 500 MG tablet Take 1 tablet (500 mg total) by mouth every 4 (four) hours as needed for moderate pain. 08/25/16   Davon Folta L Rhaya Coale, PA-C  ondansetron (ZOFRAN ODT) 4 MG disintegrating tablet Take 1 tablet (4 mg total) by mouth every 8 (eight) hours as needed for nausea or vomiting. Patient not taking: Reported on 07/24/2016 05/26/16   Emily FilbertJonathan E Williams, MD  Prenatal Vit-Fe Fumarate-FA (MULTIVITAMIN-PRENATAL) 27-0.8 MG TABS tablet Take 1 tablet by mouth daily at 12 noon.    Historical Provider, MD     Allergies Patient has no known allergies.  No family history on file.  Social History Social History  Substance Use Topics  . Smoking status: Current Every Day Smoker    Packs/day: 0.25    Types: Cigarettes  . Smokeless tobacco: Never Used  . Alcohol use No     Review of Systems  Constitutional: No fever/chills Eyes: No visual changes.  Cardiovascular: No chest pain, palpitations. Respiratory: No shortness of breath. No wheezing.  Gastrointestinal: No abdominal pain.  No nausea, vomiting.  No diarrhea.   Genitourinary: Negative for dysuria, hematuria, pelvic pain, vaginal discharge or bleeding. No urinary hesitancy, urgency or increased frequency. Musculoskeletal: Positive for back and right leg pain.  Skin: Negative for rash, Redness, swelling, abnormal warmth, skin sores. Neurological: Negative for headaches, focal weakness or numbness. No tingling. No saddle paresthesias or loss of bowel or bladder control. 10-point ROS otherwise negative.  ____________________________________________   PHYSICAL EXAM:  VITAL SIGNS: ED Triage Vitals  Enc Vitals Group     BP 08/25/16 1329 (!) 113/57     Pulse Rate 08/25/16 1329 85     Resp 08/25/16 1329 20     Temp 08/25/16 1329 98.1 F (36.7 C)     Temp Source 08/25/16 1329 Oral     SpO2 08/25/16 1329 99 %     Weight 08/25/16 1330 120 lb (54.4 kg)     Height 08/25/16 1330 5\' 3"  (1.6 m)     Head Circumference --      Peak Flow --      Pain Score 08/25/16 1330 10  Pain Loc --      Pain Edu? --      Excl. in GC? --      Constitutional: Alert and oriented. Well appearing and in no acute distress. Eyes: Conjunctivae are normal.  Head: Atraumatic. Neck: Supple with full range of motion. Hematological/Lymphatic/Immunilogical: No cervical lymphadenopathy. Cardiovascular: Normal rate, regular rhythm. Normal S1 and S2.  Good peripheral circulation with 2+ pulses noted in bilateral lower extremities. Capillary refill is  brisk in all digits of the right foot. Respiratory: Normal respiratory effort without tachypnea or retractions. Lungs CTAB with breath sounds noted in all lung fields. No wheeze, rhonchi, rales Musculoskeletal: Tenderness to palpation of the right posterior calf without masses or abnormalities. Negative Hoamans. No tenderness to palpation of the lumbar spine, right hip or upper leg. No SI joint tenderness. Negative bilateral straight leg raise. No lower extremity tenderness nor edema.  No joint effusions. Neurologic:  Normal speech and language. No gross focal neurologic deficits are appreciated. Sensation light touch grossly intact upper and lower extremity. Skin:  Skin is warm, dry and intact. No rash or redness, abnormal warmth, swelling or skin sores noted. Psychiatric: Mood and affect are normal. Speech and behavior are normal. Patient exhibits appropriate insight and judgement.   ____________________________________________   LABS  None ____________________________________________  EKG  None ____________________________________________  RADIOLOGY I, Ernestene Kiel Stefania Goulart, personally viewed and evaluated these images (plain radiographs) as part of my medical decision making, as well as reviewing the written report by the radiologist.  US Venous Img Lower Unilateral Right  Result Date: 08/25/2016 CLINICAL DATA:  Pain x5 hours.  Pregnant. EXAM: RIGHT LOWER EXTREMITY VENOUS DOPPLER ULTRASOUND TECHNIQUE: Gray-scale sonography with compression, as well as color and duplex ultrasound, were performed to evaluate the deep venous system from the level of the common femoral vein through the popliteal and proximal calf veins. COMPARISON:  None FINDINGS: Normal compressibility of the common femoral, superficial femoral, and popliteal veins, as well as the proximal calf veins. No filling defects to suggest DVT on grayscale or color Doppler imaging. Doppler waveforms show normal direction of venous flow,  normal respiratory phasicity and response to augmentation. Survey views of the contralateral common femoral vein are unremarkable. IMPRESSION: No evidence of  lower extremity deep vein thrombosis, right. Electronically Signed   By: Corlis Leak M.D.   On: 08/25/2016 15:36    ____________________________________________    PROCEDURES  Procedure(s) performed: None   Procedures   Medications  acetaminophen (TYLENOL) tablet 650 mg (650 mg Oral Given 08/25/16 1551)     ____________________________________________   INITIAL IMPRESSION / ASSESSMENT AND PLAN / ED COURSE  Pertinent labs & imaging results that were available during my care of the patient were reviewed by me and considered in my medical decision making (see chart for details).  Clinical Course     Patient's diagnosis is consistent with Right leg pain. Patient will be discharged home with prescriptions for acetaminophen to take as needed for pain. Patient is to follow up with her primary care provider if symptoms persist past this treatment course. Patient is given strict ED precautions to return to the ED for any worsening or new symptoms.    ____________________________________________  FINAL CLINICAL IMPRESSION(S) / ED DIAGNOSES  Final diagnoses:  Right leg pain      NEW MEDICATIONS STARTED DURING THIS VISIT:  Discharge Medication List as of 08/25/2016  4:15 PM    START taking these medications   Details  acetaminophen (TYLENOL) 500 MG tablet  Take 1 tablet (500 mg total) by mouth every 4 (four) hours as needed for moderate pain., Starting Fri 08/25/2016, Print             Ernestene Kiel Highland, PA-C 08/25/16 1744    Arnaldo Natal, MD 08/25/16 1747

## 2016-08-25 NOTE — ED Triage Notes (Addendum)
Pt reports waking up this morning with right leg pain, pt reports difficulty bearing weight and walking due to the pain.  Pt 5 months pregnant; pt denies any abdominal pain.  Pt A/Ox4, vitals WDL.

## 2016-08-28 ENCOUNTER — Ambulatory Visit: Admission: RE | Admit: 2016-08-28 | Payer: Medicaid Other | Source: Ambulatory Visit

## 2016-09-11 LAB — OB RESULTS CONSOLE GC/CHLAMYDIA
Chlamydia: NEGATIVE
GC PROBE AMP, GENITAL: NEGATIVE

## 2016-09-13 ENCOUNTER — Inpatient Hospital Stay
Admission: EM | Admit: 2016-09-13 | Discharge: 2016-09-13 | Disposition: A | Discharge status: Home / Self Care | Payer: Medicaid Other | Attending: Obstetrics and Gynecology | Admitting: Obstetrics and Gynecology

## 2016-09-13 ENCOUNTER — Encounter: Payer: Self-pay | Admitting: *Deleted

## 2016-09-13 DIAGNOSIS — O99332 Smoking (tobacco) complicating pregnancy, second trimester: Secondary | ICD-10-CM | POA: Diagnosis not present

## 2016-09-13 DIAGNOSIS — Z3A25 25 weeks gestation of pregnancy: Secondary | ICD-10-CM | POA: Insufficient documentation

## 2016-09-13 DIAGNOSIS — F329 Major depressive disorder, single episode, unspecified: Secondary | ICD-10-CM | POA: Insufficient documentation

## 2016-09-13 DIAGNOSIS — M549 Dorsalgia, unspecified: Secondary | ICD-10-CM | POA: Diagnosis present

## 2016-09-13 DIAGNOSIS — R102 Pelvic and perineal pain: Secondary | ICD-10-CM | POA: Diagnosis present

## 2016-09-13 DIAGNOSIS — O99012 Anemia complicating pregnancy, second trimester: Secondary | ICD-10-CM | POA: Diagnosis not present

## 2016-09-13 DIAGNOSIS — O26892 Other specified pregnancy related conditions, second trimester: Secondary | ICD-10-CM | POA: Insufficient documentation

## 2016-09-13 DIAGNOSIS — R079 Chest pain, unspecified: Secondary | ICD-10-CM | POA: Diagnosis present

## 2016-09-13 DIAGNOSIS — O99342 Other mental disorders complicating pregnancy, second trimester: Secondary | ICD-10-CM | POA: Insufficient documentation

## 2016-09-13 HISTORY — DX: Major depressive disorder, single episode, unspecified: F32.9

## 2016-09-13 HISTORY — DX: Depression, unspecified: F32.A

## 2016-09-13 LAB — CBC
HCT: 31.3 % — ABNORMAL LOW (ref 35.0–47.0)
Hemoglobin: 10.7 g/dL — ABNORMAL LOW (ref 12.0–16.0)
MCH: 31.9 pg (ref 26.0–34.0)
MCHC: 34.1 g/dL (ref 32.0–36.0)
MCV: 93.6 fL (ref 80.0–100.0)
Platelets: 116 10*3/uL — ABNORMAL LOW (ref 150–440)
RBC: 3.34 MIL/uL — AB (ref 3.80–5.20)
RDW: 12.9 % (ref 11.5–14.5)
WBC: 6.4 10*3/uL (ref 3.6–11.0)

## 2016-09-13 LAB — COMPREHENSIVE METABOLIC PANEL
ALBUMIN: 3 g/dL — AB (ref 3.5–5.0)
ALT: 16 U/L (ref 14–54)
AST: 20 U/L (ref 15–41)
Alkaline Phosphatase: 67 U/L (ref 38–126)
Anion gap: 6 (ref 5–15)
BUN: 6 mg/dL (ref 6–20)
CO2: 22 mmol/L (ref 22–32)
CREATININE: 0.51 mg/dL (ref 0.44–1.00)
Calcium: 8.6 mg/dL — ABNORMAL LOW (ref 8.9–10.3)
Chloride: 107 mmol/L (ref 101–111)
GFR calc Af Amer: >60 mL/min (ref >60)
Glucose, Bld: 70 mg/dL (ref 65–99)
POTASSIUM: 3.4 mmol/L — AB (ref 3.5–5.1)
SODIUM: 135 mmol/L (ref 135–145)
Total Bilirubin: 0.7 mg/dL (ref 0.3–1.2)
Total Protein: 6.2 g/dL — ABNORMAL LOW (ref 6.5–8.1)

## 2016-09-13 LAB — URINALYSIS, COMPLETE (UACMP) WITH MICROSCOPIC
BILIRUBIN URINE: NEGATIVE
Glucose, UA: NEGATIVE mg/dL
HGB URINE DIPSTICK: NEGATIVE
KETONES UR: NEGATIVE mg/dL
LEUKOCYTES UA: NEGATIVE
NITRITE: NEGATIVE
PROTEIN: NEGATIVE mg/dL
Specific Gravity, Urine: 1.016 (ref 1.005–1.030)
pH: 8 (ref 5.0–8.0)

## 2016-09-13 MED ORDER — ACETAMINOPHEN 500 MG PO TABS: 1000.0000 mg | ORAL_TABLET | Freq: Once | ORAL | Status: DC

## 2016-09-13 MED ORDER — ACETAMINOPHEN 500 MG PO TABS
ORAL_TABLET | ORAL | Status: AC
  Administered 2016-09-13: 1000 mg
  Filled 2016-09-13: qty 2

## 2016-09-13 NOTE — Discharge Instructions (Signed)
Abdominal Pain During Pregnancy Belly (abdominal) pain is common during pregnancy. Most of the time, it is not a serious problem. Other times, it can be a sign that something is wrong with the pregnancy. Always tell your doctor if you have belly pain. Follow these instructions at home: Monitor your belly pain for any changes. The following actions may help you feel better:  Do not have sex (intercourse) or put anything in your vagina until you feel better.  Rest until your pain stops.  Drink clear fluids if you feel sick to your stomach (nauseous). Do not eat solid food until you feel better.  Only take medicine as told by your doctor.  Keep all doctor visits as told. Get help right away if:  You are bleeding, leaking fluid, or pieces of tissue come out of your vagina.  You have more pain or cramping.  You keep throwing up (vomiting).  You have pain when you pee (urinate) or have blood in your pee.  You have a fever.  You do not feel your baby moving as much.  You feel very weak or feel like passing out.  You have trouble breathing, with or without belly pain.  You have a very bad headache and belly pain.  You have fluid leaking from your vagina and belly pain.  You keep having watery poop (diarrhea).  Your belly pain does not go away after resting, or the pain gets worse. This information is not intended to replace advice given to you by your health care provider. Make sure you discuss any questions you have with your health care provider. Document Released: 07/19/2009 Document Revised: 03/08/2016 Document Reviewed: 02/27/2013 Elsevier Interactive Patient Education  2017 Elsevier Inc. Back Pain, Adult Back pain is very common in adults.The cause of back pain is rarely dangerous and the pain often gets better over time.The cause of your back pain may not be known. Some common causes of back pain include:  Strain of the muscles or ligaments supporting the spine.  Wear  and tear (degeneration) of the spinal disks.  Arthritis.  Direct injury to the back. For many people, back pain may return. Since back pain is rarely dangerous, most people can learn to manage this condition on their own. Follow these instructions at home: Watch your back pain for any changes. The following actions may help to lessen any discomfort you are feeling:  Remain active. It is stressful on your back to sit or stand in one place for long periods of time. Do not sit, drive, or stand in one place for more than 30 minutes at a time. Take short walks on even surfaces as soon as you are able.Try to increase the length of time you walk each day.  Exercise regularly as directed by your health care provider. Exercise helps your back heal faster. It also helps avoid future injury by keeping your muscles strong and flexible.  Do not stay in bed.Resting more than 1-2 days can delay your recovery.  Pay attention to your body when you bend and lift. The most comfortable positions are those that put less stress on your recovering back. Always use proper lifting techniques, including:  Bending your knees.  Keeping the load close to your body.  Avoiding twisting.  Find a comfortable position to sleep. Use a firm mattress and lie on your side with your knees slightly bent. If you lie on your back, put a pillow under your knees.  Avoid feeling anxious or stressed.Stress increases  muscle tension and can worsen back pain.It is important to recognize when you are anxious or stressed and learn ways to manage it, such as with exercise.  Take medicines only as directed by your health care provider. Over-the-counter medicines to reduce pain and inflammation are often the most helpful.Your health care provider may prescribe muscle relaxant drugs.These medicines help dull your pain so you can more quickly return to your normal activities and healthy exercise.  Apply ice to the injured area:  Put  ice in a plastic bag.  Place a towel between your skin and the bag.  Leave the ice on for 20 minutes, 2-3 times a day for the first 2-3 days. After that, ice and heat may be alternated to reduce pain and spasms.  Maintain a healthy weight. Excess weight puts extra stress on your back and makes it difficult to maintain good posture. Contact a health care provider if:  You have pain that is not relieved with rest or medicine.  You have increasing pain going down into the legs or buttocks.  You have pain that does not improve in one week.  You have night pain.  You lose weight.  You have a fever or chills. Get help right away if:  You develop new bowel or bladder control problems.  You have unusual weakness or numbness in your arms or legs.  You develop nausea or vomiting.  You develop abdominal pain.  You feel faint. This information is not intended to replace advice given to you by your health care provider. Make sure you discuss any questions you have with your health care provider. Document Released: 07/31/2005 Document Revised: 12/09/2015 Document Reviewed: 12/02/2013 Elsevier Interactive Patient Education  2017 [employer]

## 2016-09-13 NOTE — OB Triage Note (Signed)
Went to [facility] today for back and abdominal pain. Was sent to hospital via EMS for evaluation. Denies vaginal bleeding and LOF. Reports vaginal discharge - mucus. Pain started this morning around 10 am. Was woken up from sleep from pain. Elaina HoopsElks, Denard Tuminello S

## 2016-09-13 NOTE — Discharge Summary (Signed)
Obstetric Discharge Summary Reason for Admission: pelvic pain, chest pain and back pain Prenatal Procedures: ultrasound Intrapartum Procedures: not IP yet Postpartum Procedures: none Complications-Operative and Postpartum: none Hemoglobin  Date Value Ref Range Status  09/13/2016 10.7 (L) 12.0 - 16.0 g/dL Final   HGB  Date Value Ref Range Status  08/05/2014 13.4 12.0 - 16.0 g/dL Final   HCT  Date Value Ref Range Status  09/13/2016 31.3 (L) 35.0 - 47.0 % Final  08/05/2014 40.6 35.0 - 47.0 % Final    Physical Exam:  General: alert, cooperative and appears stated age  ZOX:WRUEAVAbd:Gravid Incision:None DVT Evaluation: Neg Homans Discharge Diagnoses: pelvic pain, back pain  Discharge Information: Date: 09/13/2016 Activity: pelvic rest Diet: routine Medications: PNV, Iron and Tylenol Condition: stable Instructions: refer to practice specific booklet and [facility]U at [facility] as [facility] Discharge to: home    Kimberly Powers 09/13/2016, 5:17 PM

## 2016-09-13 NOTE — Progress Notes (Addendum)
Patient ID: Dahlgren CallasLacorya M Harts, female   DOB: 1996-01-18, 21 y.o.   MRN: [id number] TRIAGE NOTE: Pt went to [facility] am [facility] due to c/o "severe lower pelvic pain and lower back pain". Pt rates the pain in the lower pelvis as a "10 on 1-10 scale". She states her pain starts in the public region and goes all the way up to her chest region above both breasts. She says the chest pain is constant and it does not hurt to move or breathe but, it is present. She also says the pain is in the mid to lower back. No LOF, NO UC's, no VB or any signs of PTL noted. Past Medical History:  Diagnosis Date  . Depression    Past Surgical History:  Procedure Laterality Date  . WISDOM TOOTH EXTRACTION    History reviewed. No pertinent family history.  Social History   Social History  . Marital status: Single    Spouse name: N/A  . Number of children: N/A  . Years of education: N/A   Occupational History  . Not on file.   Social History Main Topics  . Smoking status: Current Every Day Smoker    Packs/day: 0.25    Years: 7.00    Types: Cigarettes  . Smokeless tobacco: Never Used  . Alcohol use No  . Drug use: Yes    Types: Marijuana  . Sexual activity: Yes    Birth control/ protection: Injection   Other Topics Concern  . Not on file   Social History Narrative  . No narrative on file  Gen: 21 yo black female in NAD. HEENT: Eyes non-icteric, normocephalic, Resp:reg and non-labored. Heart: S1S2, RRR, Grade 3/6 Murmur. Abd: Gravid. Exam: Abd: NT to palpation. Neg CVAT bilat, Neg suprapubic tenderness. Extrems: no edema, neg Homans Cx: Long/Closed/post UC: none on monitor since arrival FHT: 150, +accels, no decels, Cat 1 strip Labs: CBC shows anemia,  A: IUP at 25 weeks 2. Pelvic pain 3. Chest discomfort (EKG:SR with PAC's) 4. Back pain P: Urine neg for UTI or pyleo. 2. Anemia:Plan OTC Fe replacement 3. K 3.4: advised to eat a bananna qd. 4. DC  [facility]

## 2016-09-14 LAB — URINE CULTURE

## 2016-10-24 ENCOUNTER — Other Ambulatory Visit: Payer: Self-pay | Admitting: Primary Care

## 2016-10-24 DIAGNOSIS — Z3689 Encounter for other specified antenatal screening: Secondary | ICD-10-CM

## 2016-11-06 ENCOUNTER — Other Ambulatory Visit: Payer: Self-pay | Admitting: *Deleted

## 2016-11-06 DIAGNOSIS — O2613 Low weight gain in pregnancy, third trimester: Secondary | ICD-10-CM

## 2016-11-09 ENCOUNTER — Ambulatory Visit: Payer: Medicaid Other

## 2016-11-17 LAB — OB RESULTS CONSOLE GBS: STREP GROUP B AG: POSITIVE

## 2016-12-04 ENCOUNTER — Ambulatory Visit
Admission: RE | Admit: 2016-12-04 | Discharge: 2016-12-04 | Disposition: A | Discharge status: Home / Self Care | Payer: Medicaid Other | Source: Ambulatory Visit | Attending: Maternal and Fetal Medicine | Admitting: Maternal and Fetal Medicine

## 2016-12-04 DIAGNOSIS — O2613 Low weight gain in pregnancy, third trimester: Secondary | ICD-10-CM | POA: Insufficient documentation

## 2016-12-04 DIAGNOSIS — Z3689 Encounter for other specified antenatal screening: Secondary | ICD-10-CM | POA: Insufficient documentation

## 2016-12-04 DIAGNOSIS — Z3A37 37 weeks gestation of pregnancy: Secondary | ICD-10-CM | POA: Diagnosis not present

## 2016-12-11 ENCOUNTER — Observation Stay
Admission: EM | Admit: 2016-12-11 | Discharge: 2016-12-11 | Disposition: A | Discharge status: Home / Self Care | Payer: Medicaid Other | Attending: Obstetrics and Gynecology | Admitting: Obstetrics and Gynecology

## 2016-12-11 DIAGNOSIS — R109 Unspecified abdominal pain: Secondary | ICD-10-CM | POA: Diagnosis present

## 2016-12-11 DIAGNOSIS — Z3A38 38 weeks gestation of pregnancy: Secondary | ICD-10-CM | POA: Insufficient documentation

## 2016-12-11 DIAGNOSIS — O471 False labor at or after 37 completed weeks of gestation: Secondary | ICD-10-CM | POA: Diagnosis not present

## 2016-12-11 LAB — URINALYSIS, COMPLETE (UACMP) WITH MICROSCOPIC
Bilirubin Urine: NEGATIVE
GLUCOSE, UA: NEGATIVE mg/dL
Hgb urine dipstick: NEGATIVE
KETONES UR: NEGATIVE mg/dL
LEUKOCYTES UA: NEGATIVE
Nitrite: NEGATIVE
PH: 7 (ref 5.0–8.0)
Protein, ur: NEGATIVE mg/dL
RBC / HPF: NONE SEEN RBC/hpf (ref 0–5)
Specific Gravity, Urine: 1.008 (ref 1.005–1.030)

## 2016-12-11 LAB — WET PREP, GENITAL
CLUE CELLS WET PREP: NONE SEEN
SPERM: NONE SEEN
Trich, Wet Prep: NONE SEEN
WBC WET PREP: NONE SEEN
Yeast Wet Prep HPF POC: NONE SEEN

## 2016-12-11 NOTE — OB Triage Provider Note (Signed)
TRIAGE VISIT with NST   Kimberly Powers is a 21 y.o. G1P0000. She is at [redacted]w[redacted]d gestation, presenting with signs of labor. Prenatal care at [facility].  Indication: Contractions  S: Resting comfortably. Feeling CTX, no VB. Active fetal movement. O:  BP 114/73 (BP Location: Right Arm)   Pulse 84   Temp 97.4 F (36.3 C) (Oral)   Resp 18   LMP 03/17/2016  Results for orders placed or performed during the hospital encounter of 12/11/16 (from the past 48 hour(s))  Wet prep, genital   Collection Time: 12/11/16 12:35 PM  Result Value Ref Range   Yeast Wet Prep HPF POC NONE SEEN NONE SEEN   Trich, Wet Prep NONE SEEN NONE SEEN   Clue Cells Wet Prep HPF POC NONE SEEN NONE SEEN   WBC, Wet Prep HPF POC NONE SEEN NONE SEEN   Sperm NONE SEEN   Urinalysis, Complete w Microscopic   Collection Time: 12/11/16 12:35 PM  Result Value Ref Range   Color, Urine YELLOW (A) YELLOW   APPearance CLEAR (A) CLEAR   Specific Gravity, Urine 1.008 1.005 - 1.030   pH 7.0 5.0 - 8.0   Glucose, UA NEGATIVE NEGATIVE mg/dL   Hgb urine dipstick NEGATIVE NEGATIVE   Bilirubin Urine NEGATIVE NEGATIVE   Ketones, ur NEGATIVE NEGATIVE mg/dL   Protein, ur NEGATIVE NEGATIVE mg/dL   Nitrite NEGATIVE NEGATIVE   Leukocytes, UA NEGATIVE NEGATIVE   RBC / HPF NONE SEEN 0 - 5 RBC/hpf   WBC, UA 0-5 0 - 5 WBC/hpf   Bacteria, UA RARE (A) NONE SEEN   Squamous Epithelial / LPF 0-5 (A) NONE SEEN     Gen: NAD, AAOx3      Abd: FNTTP      Ext: Non-tender, Nonedmeatous    NST/FHT: 140, mod var, +accels, no decels  TOCO: quiet SVE: closed/thick/high/posterior/firm  NST: Reactive. See FHT above for particulars.  A/P:  21 y.o. G1P0000 [redacted]w[redacted]d with contractions at term.   Labor: not present.   R/o ROM: SSE negative x 3. Wet prep neg for yeast and BV. Normal amount of white discharge.   Fetal Wellbeing: NST reactive Reassuring Cat 1 tracing.  D/c home stable, precautions reviewed, follow-up as scheduled.

## 2016-12-11 NOTE — Discharge Summary (Signed)
Kimberly Powers is a 21 y.o. G1P0000. She is at [redacted]w[redacted]d gestation, presenting with signs of labor. P[facility]atal care at [facility].  Indication: Contractions  S: Resting comfortably. Feeling CTX, no VB. Active fetal movement. O:  BP 114/73 (BP Location: Right Arm)   Pulse 84   Temp 97.4 F (36.3 C) (Oral)   Resp 18   LMP 03/17/2016       Results for orders placed or performed during the hospital encounter of 12/11/16 (from the past 48 hour(s))  Wet prep, genital   Collection Time: 12/11/16 12:35 PM  Result Value Ref Range   Yeast Wet Prep HPF POC NONE SEEN NONE SEEN   Trich, Wet Prep NONE SEEN NONE SEEN   Clue Cells Wet Prep HPF POC NONE SEEN NONE SEEN   WBC, Wet Prep HPF POC NONE SEEN NONE SEEN   Sperm NONE SEEN   Urinalysis, Complete w Microscopic   Collection Time: 12/11/16 12:35 PM  Result Value Ref Range   Color, Urine YELLOW (A) YELLOW   APPearance CLEAR (A) CLEAR   Specific Gravity, Urine 1.008 1.005 - 1.030   pH 7.0 5.0 - 8.0   Glucose, UA NEGATIVE NEGATIVE mg/dL   Hgb urine dipstick NEGATIVE NEGATIVE   Bilirubin Urine NEGATIVE NEGATIVE   Ketones, ur NEGATIVE NEGATIVE mg/dL   Protein, ur NEGATIVE NEGATIVE mg/dL   Nitrite NEGATIVE NEGATIVE   Leukocytes, UA NEGATIVE NEGATIVE   RBC / HPF NONE SEEN 0 - 5 RBC/hpf   WBC, UA 0-5 0 - 5 WBC/hpf   Bacteria, UA RARE (A) NONE SEEN   Squamous Epithelial / LPF 0-5 (A) NONE SEEN   Gen: NAD, AAOx3                                           Abd: FNTTP                                                     Ext: Non-tender, Nonedmeatous                      NST/FHT: 140, mod var, +accels, no decels  TOCO: quiet SVE: closed/thick/high/posterior/firm  NST: Reactive. See FHT above for particulars.  A/P:  21 y.o. G1P0000 [redacted]w[redacted]d with contractions at term.   Labor: not present.   R/o ROM: SSE negative x 3. Wet prep neg for yeast and BV. Normal amount of white discharge.   Fetal Wellbeing: NST reactive  Reassuring Cat 1 tracing.  D/c home stable, precautions reviewed, follow-up as scheduled.

## 2016-12-11 NOTE — OB Triage Note (Signed)
Pt c/o having lower abdominal pain that feels like pressure since last night. The pain is constant, she says it gets worse when she moves. Baby is still moving. Denies and discharge or drainage.

## 2016-12-12 ENCOUNTER — Inpatient Hospital Stay
Admission: EM | Admit: 2016-12-12 | Discharge: 2016-12-14 | DRG: 775 | Disposition: A | Discharge status: Home / Self Care | Payer: Medicaid Other | Attending: Obstetrics and Gynecology | Admitting: Obstetrics and Gynecology

## 2016-12-12 ENCOUNTER — Encounter: Payer: Self-pay | Admitting: *Deleted

## 2016-12-12 DIAGNOSIS — Z3490 Encounter for supervision of normal pregnancy, unspecified, unspecified trimester: Secondary | ICD-10-CM

## 2016-12-12 DIAGNOSIS — O479 False labor, unspecified: Secondary | ICD-10-CM | POA: Diagnosis present

## 2016-12-12 DIAGNOSIS — O99324 Drug use complicating childbirth: Secondary | ICD-10-CM | POA: Diagnosis present

## 2016-12-12 DIAGNOSIS — F1290 Cannabis use, unspecified, uncomplicated: Secondary | ICD-10-CM | POA: Diagnosis present

## 2016-12-12 DIAGNOSIS — D509 Iron deficiency anemia, unspecified: Secondary | ICD-10-CM | POA: Diagnosis present

## 2016-12-12 DIAGNOSIS — F17210 Nicotine dependence, cigarettes, uncomplicated: Secondary | ICD-10-CM | POA: Diagnosis present

## 2016-12-12 DIAGNOSIS — O99334 Smoking (tobacco) complicating childbirth: Secondary | ICD-10-CM | POA: Diagnosis present

## 2016-12-12 DIAGNOSIS — Z3A38 38 weeks gestation of pregnancy: Secondary | ICD-10-CM

## 2016-12-12 DIAGNOSIS — O9902 Anemia complicating childbirth: Secondary | ICD-10-CM | POA: Diagnosis present

## 2016-12-12 HISTORY — DX: Post-traumatic stress disorder, unspecified: F43.10

## 2016-12-12 MED ORDER — MORPHINE SULFATE (PF) 10 MG/ML IV SOLN
10.0000 mg | Freq: Once | INTRAVENOUS | Status: AC
  Administered 2016-12-12: 10 mg via INTRAMUSCULAR
  Filled 2016-12-12: qty 1
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
## 2016-12-12 NOTE — OB Triage Note (Signed)
Recvd pt from ED. Pt c/o contractions "that are close". Pt says she was here last night and that the contractions have gotten worse and she has not been able to sleep. Pt has had intercourse in the past 24 hours. Denies vaginal bleeding. Feeling baby move ok.

## 2016-12-13 ENCOUNTER — Inpatient Hospital Stay: Payer: Medicaid Other | Admitting: Anesthesiology

## 2016-12-13 ENCOUNTER — Encounter: Payer: Self-pay | Admitting: Anesthesiology

## 2016-12-13 DIAGNOSIS — O99324 Drug use complicating childbirth: Secondary | ICD-10-CM | POA: Diagnosis present

## 2016-12-13 DIAGNOSIS — Z3490 Encounter for supervision of normal pregnancy, unspecified, unspecified trimester: Secondary | ICD-10-CM

## 2016-12-13 DIAGNOSIS — Z3A38 38 weeks gestation of pregnancy: Secondary | ICD-10-CM | POA: Diagnosis not present

## 2016-12-13 DIAGNOSIS — O9902 Anemia complicating childbirth: Secondary | ICD-10-CM | POA: Diagnosis present

## 2016-12-13 DIAGNOSIS — D509 Iron deficiency anemia, unspecified: Secondary | ICD-10-CM | POA: Diagnosis present

## 2016-12-13 DIAGNOSIS — O99334 Smoking (tobacco) complicating childbirth: Secondary | ICD-10-CM | POA: Diagnosis present

## 2016-12-13 DIAGNOSIS — Z3493 Encounter for supervision of normal pregnancy, unspecified, third trimester: Secondary | ICD-10-CM | POA: Diagnosis present

## 2016-12-13 DIAGNOSIS — F1290 Cannabis use, unspecified, uncomplicated: Secondary | ICD-10-CM | POA: Diagnosis present

## 2016-12-13 DIAGNOSIS — F17210 Nicotine dependence, cigarettes, uncomplicated: Secondary | ICD-10-CM | POA: Diagnosis present

## 2016-12-13 LAB — CBC
HCT: 33.3 % — ABNORMAL LOW (ref 35.0–47.0)
Hemoglobin: 11.3 g/dL — ABNORMAL LOW (ref 12.0–16.0)
MCH: 31.5 pg (ref 26.0–34.0)
MCHC: 34.1 g/dL (ref 32.0–36.0)
MCV: 92.5 fL (ref 80.0–100.0)
Platelets: 128 10*3/uL — ABNORMAL LOW (ref 150–440)
RBC: 3.6 MIL/uL — ABNORMAL LOW (ref 3.80–5.20)
RDW: 13.5 % (ref 11.5–14.5)
WBC: 7.6 10*3/uL (ref 3.6–11.0)

## 2016-12-13 LAB — CHLAMYDIA/NGC RT PCR (ARMC ONLY)
CHLAMYDIA TR: NOT DETECTED
N GONORRHOEAE: NOT DETECTED

## 2016-12-13 LAB — TYPE AND SCREEN
ABO/RH(D): O POS
ANTIBODY SCREEN: NEGATIVE

## 2016-12-13 MED ORDER — FERROUS SULFATE 325 (65 FE) MG PO TABS
325.0000 mg | ORAL_TABLET | Freq: Two times a day (BID) | ORAL | Status: DC
  Administered 2016-12-13 – 2016-12-14 (×2): 325 mg via ORAL
  Filled 2016-12-13 (×2): qty 1

## 2016-12-13 MED ORDER — PHENYLEPHRINE 40 MCG/ML (10ML) SYRINGE FOR IV PUSH (FOR BLOOD PRESSURE SUPPORT)
80.0000 ug | PREFILLED_SYRINGE | INTRAVENOUS | Status: DC | PRN
  Filled 2016-12-13: qty 5

## 2016-12-13 MED ORDER — BUTORPHANOL TARTRATE 2 MG/ML IJ SOLN
2.0000 mg | INTRAMUSCULAR | Status: DC | PRN
  Administered 2016-12-13: 2 mg via INTRAVENOUS
  Filled 2016-12-13: qty 2

## 2016-12-13 MED ORDER — SIMETHICONE 80 MG PO CHEW: 80.0000 mg | CHEWABLE_TABLET | ORAL | Status: DC | PRN

## 2016-12-13 MED ORDER — PRENATAL MULTIVITAMIN CH
1.0000 | ORAL_TABLET | Freq: Every day | ORAL | Status: DC
  Administered 2016-12-13 – 2016-12-14 (×2): 1 via ORAL
  Filled 2016-12-13 (×2): qty 1

## 2016-12-13 MED ORDER — FENTANYL 2.5 MCG/ML W/ROPIVACAINE 0.2% IN NS 100 ML EPIDURAL INFUSION (ARMC-ANES): 10.0000 mL/h | EPIDURAL | Status: DC

## 2016-12-13 MED ORDER — SODIUM CHLORIDE 0.9% FLUSH
3.0000 mL | INTRAVENOUS | Status: DC | PRN
  Administered 2016-12-13 (×2): 3 mL via INTRAVENOUS
  Filled 2016-12-13 (×2): qty 3

## 2016-12-13 MED ORDER — ONDANSETRON HCL 4 MG/2ML IJ SOLN: 4.0000 mg | INTRAMUSCULAR | Status: DC | PRN

## 2016-12-13 MED ORDER — DIPHENHYDRAMINE HCL 25 MG PO CAPS: 25.0000 mg | ORAL_CAPSULE | Freq: Four times a day (QID) | ORAL | Status: DC | PRN

## 2016-12-13 MED ORDER — SODIUM CHLORIDE 0.9 % IV SOLN
2.0000 g | Freq: Once | INTRAVENOUS | Status: AC
  Administered 2016-12-13: 2 g via INTRAVENOUS

## 2016-12-13 MED ORDER — FENTANYL 2.5 MCG/ML W/ROPIVACAINE 0.2% IN NS 100 ML EPIDURAL INFUSION (ARMC-ANES)
EPIDURAL | Status: AC
  Filled 2016-12-13: qty 100

## 2016-12-13 MED ORDER — FENTANYL 2.5 MCG/ML W/ROPIVACAINE 0.2% IN NS 100 ML EPIDURAL INFUSION (ARMC-ANES)
EPIDURAL | Status: DC | PRN
  Administered 2016-12-13: 10 mL/h via EPIDURAL

## 2016-12-13 MED ORDER — OXYTOCIN 40 UNITS IN LACTATED RINGERS INFUSION - SIMPLE MED
2.5000 [IU]/h | INTRAVENOUS | Status: DC
  Filled 2016-12-13: qty 1000

## 2016-12-13 MED ORDER — ACETAMINOPHEN 325 MG PO TABS: 650.0000 mg | ORAL_TABLET | ORAL | Status: DC | PRN

## 2016-12-13 MED ORDER — BUTORPHANOL TARTRATE 2 MG/ML IJ SOLN
1.0000 mg | INTRAMUSCULAR | Status: DC
  Filled 2016-12-13: qty 2

## 2016-12-13 MED ORDER — SODIUM CHLORIDE 0.9 % IV SOLN
INTRAVENOUS | Status: AC
  Administered 2016-12-13: 2 g via INTRAVENOUS
  Filled 2016-12-13: qty 2000

## 2016-12-13 MED ORDER — SODIUM CHLORIDE FLUSH 0.9 % IV SOLN
INTRAVENOUS | Status: AC
  Administered 2016-12-13: 3 mL via INTRAVENOUS
  Filled 2016-12-13: qty 20

## 2016-12-13 MED ORDER — EPHEDRINE 5 MG/ML INJ
10.0000 mg | INTRAVENOUS | Status: DC | PRN
  Filled 2016-12-13: qty 2

## 2016-12-13 MED ORDER — OXYTOCIN 40 UNITS IN LACTATED RINGERS INFUSION - SIMPLE MED: 2.5000 [IU]/h | INTRAVENOUS | Status: DC | PRN

## 2016-12-13 MED ORDER — AMMONIA AROMATIC IN INHA
RESPIRATORY_TRACT | Status: AC
  Filled 2016-12-13: qty 10

## 2016-12-13 MED ORDER — LACTATED RINGERS IV SOLN
500.0000 mL | INTRAVENOUS | Status: DC | PRN
Start: 2016-12-13 — End: 2016-12-13

## 2016-12-13 MED ORDER — LIDOCAINE HCL (PF) 1 % IJ SOLN
INTRAMUSCULAR | Status: AC
  Filled 2016-12-13: qty 30

## 2016-12-13 MED ORDER — BENZOCAINE-MENTHOL 20-0.5 % EX AERO: 1.0000 "application " | INHALATION_SPRAY | CUTANEOUS | Status: DC | PRN

## 2016-12-13 MED ORDER — LACTATED RINGERS IV SOLN
500.0000 mL | Freq: Once | INTRAVENOUS | Status: AC
  Administered 2016-12-13: 1000 mL via INTRAVENOUS

## 2016-12-13 MED ORDER — IBUPROFEN 600 MG PO TABS
600.0000 mg | ORAL_TABLET | Freq: Four times a day (QID) | ORAL | Status: DC
  Administered 2016-12-13 (×2): 600 mg via ORAL
  Filled 2016-12-13 (×2): qty 1

## 2016-12-13 MED ORDER — ZOLPIDEM TARTRATE 5 MG PO TABS: 5.0000 mg | ORAL_TABLET | Freq: Every evening | ORAL | Status: DC | PRN

## 2016-12-13 MED ORDER — MAGNESIUM HYDROXIDE 400 MG/5ML PO SUSP: 30.0000 mL | ORAL | Status: DC | PRN

## 2016-12-13 MED ORDER — EPHEDRINE 5 MG/ML INJ
10.0000 mg | INTRAVENOUS | Status: DC | PRN
Start: 2016-12-13 — End: 2016-12-13
  Filled 2016-12-13: qty 2

## 2016-12-13 MED ORDER — COCONUT OIL OIL: 1.0000 "application " | TOPICAL_OIL | Status: DC | PRN

## 2016-12-13 MED ORDER — DIBUCAINE 1 % RE OINT: 1.0000 "application " | TOPICAL_OINTMENT | RECTAL | Status: DC | PRN

## 2016-12-13 MED ORDER — ONDANSETRON HCL 4 MG PO TABS: 4.0000 mg | ORAL_TABLET | ORAL | Status: DC | PRN

## 2016-12-13 MED ORDER — DIPHENHYDRAMINE HCL 50 MG/ML IJ SOLN: 12.5000 mg | INTRAMUSCULAR | Status: DC | PRN

## 2016-12-13 MED ORDER — OXYTOCIN 10 UNIT/ML IJ SOLN
INTRAMUSCULAR | Status: AC
  Filled 2016-12-13: qty 2

## 2016-12-13 MED ORDER — OXYTOCIN BOLUS FROM INFUSION: 500.0000 mL | Freq: Once | INTRAVENOUS | Status: DC

## 2016-12-13 MED ORDER — SODIUM CHLORIDE 0.9 % IV SOLN
1.0000 g | INTRAVENOUS | Status: DC
  Administered 2016-12-13: 1 g via INTRAVENOUS
  Filled 2016-12-13 (×7): qty 1000

## 2016-12-13 MED ORDER — MISOPROSTOL 200 MCG PO TABS
ORAL_TABLET | ORAL | Status: AC
  Filled 2016-12-13: qty 4

## 2016-12-13 MED ORDER — BUTORPHANOL TARTRATE 2 MG/ML IJ SOLN
2.0000 mg | INTRAMUSCULAR | Status: DC
  Administered 2016-12-13: 2 mg via INTRAVENOUS

## 2016-12-13 MED ORDER — LACTATED RINGERS IV SOLN
INTRAVENOUS | Status: DC
  Administered 2016-12-13: 07:00:00 via INTRAVENOUS

## 2016-12-13 MED ORDER — BUPIVACAINE HCL (PF) 0.25 % IJ SOLN
INTRAMUSCULAR | Status: DC | PRN
  Administered 2016-12-13: 5 mL via EPIDURAL

## 2016-12-13 MED ORDER — IBUPROFEN 600 MG PO TABS
600.0000 mg | ORAL_TABLET | Freq: Four times a day (QID) | ORAL | Status: DC
  Administered 2016-12-14 (×3): 600 mg via ORAL
  Filled 2016-12-13 (×3): qty 1

## 2016-12-13 MED ORDER — MEASLES, MUMPS & RUBELLA VAC ~~LOC~~ INJ
0.5000 mL | INJECTION | Freq: Once | SUBCUTANEOUS | Status: DC
  Filled 2016-12-13: qty 0.5

## 2016-12-13 MED ORDER — WITCH HAZEL-GLYCERIN EX PADS: 1.0000 "application " | MEDICATED_PAD | CUTANEOUS | Status: DC | PRN

## 2016-12-13 MED ORDER — SENNOSIDES-DOCUSATE SODIUM 8.6-50 MG PO TABS
2.0000 | ORAL_TABLET | ORAL | Status: DC
  Administered 2016-12-13: 2 via ORAL
  Filled 2016-12-13: qty 2
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
## 2016-12-13 NOTE — Anesthesia Preprocedure Evaluation (Signed)
Anesthesia Evaluation  Patient identified by MRN, date of birth, ID band Patient awake    Reviewed: Allergy & Precautions, NPO status , Patient's Chart, lab work & pertinent test results, reviewed documented beta blocker date and time   Airway Mallampati: II  TM Distance: >3 FB     Dental  (+) Chipped   Pulmonary Current Smoker,           Cardiovascular      Neuro/Psych    GI/Hepatic   Endo/Other    Renal/GU      Musculoskeletal   Abdominal   Peds  Hematology   Anesthesia Other Findings PTSD.  Reproductive/Obstetrics                             Anesthesia Physical Anesthesia Plan  ASA: II  Anesthesia Plan: Epidural   Post-op Pain Management:    Induction: Intravenous  Airway Management Planned:   Additional Equipment:   Intra-op Plan:   Post-operative Plan:   Informed Consent: I have reviewed the patients History and Physical, chart, labs and discussed the procedure including the risks, benefits and alternatives for the proposed anesthesia with the patient or authorized representative who has indicated his/her understanding and acceptance.     Plan Discussed with: CRNA  Anesthesia Plan Comments:         Anesthesia Quick Evaluation

## 2016-12-13 NOTE — Anesthesia Procedure Notes (Signed)
Epidural Patient location during procedure: OB  Staffing Anesthesiologist: Berdine Addison Performed: anesthesiologist   Preanesthetic Checklist Completed: patient identified, site marked, surgical consent, pre-op evaluation, timeout performed, IV checked, risks and benefits discussed and monitors and equipment checked  Epidural Patient position: sitting Prep: Betadine Patient monitoring: heart rate, continuous pulse ox and blood pressure Approach: midline Location: L4-L5 Injection technique: LOR saline  Needle:  Needle type: Tuohy  Needle gauge: 18 G Needle length: 9 cm and 9 Catheter type: closed end flexible Catheter size: 20 Guage Test dose: negative and 1.5% lidocaine with Epi 1:200 K  Assessment Sensory level: T10 Events: blood not aspirated, injection not painful, no injection resistance, negative IV test and no paresthesia  Additional Notes   Patient tolerated the insertion well without complications. 0618 bolus. 0981 infusion.Reason for block:procedure for pain

## 2016-12-13 NOTE — Discharge Summary (Signed)
Obstetric Discharge Summary   Patient ID: Patient Name: Kimberly Powers DOB: [birth year] MRN: [id number]  Date of Admission: 12/12/2016 Date of Discharge: 12/14/16  [facility]:  [name]   Gestational Age at Delivery: [redacted]w[redacted]d   Antepartum complications: + drug use MJ Admitting Diagnosis: labor  Secondary Diagnoses: Patient Active Problem List   Diagnosis Date Noted  . Pregnancy 12/13/2016  . Irregular uterine contractions 12/12/2016  . Indication for care in labor or delivery 12/11/2016  . Labor and delivery indication for care or intervention 07/21/2016  . First trimester screening     Augmentation: None Complications: none Intrapartum complications/course:   Date of Delivery: 12/13/16 at 1204 Delivered [id number] Delivery Type: vacuum, outlet - 1st deg laceration Anesthesia: epidural Placenta: sponatneous Laceration:  Episiotomy: none  Newborn Data: Live born female  Birth Weight:   APGAR: , 8/9      Postpartum Course  Patient had an uncomplicated postpartum course.  By time of discharge on PPD#2 her pain was controlled on oral pain medications; she had appropriate lochia and was ambulating, voiding without difficulty and tolerating regular diet.  She was deemed stable for discharge to home.     Labs: CBC Latest Ref Rng & Units 12/14/2016 12/13/2016 09/13/2016  WBC 3.6 - 11.0 K/uL 10.1 7.6 6.4  Hemoglobin 12.0 - 16.0 g/dL 6.5(H) 11.3(L) 10.7(L)  Hematocrit 35.0 - 47.0 % 29.2(L) 33.3(L) 31.3(L)  Platelets 150 - 440 K/uL 110(L) 128(L) 116(L)   O POS  Physical exam:  BP 132/78   Pulse (!) 57   Temp 98.1 F (36.7 C) (Oral)   Resp 17   Ht  (1.6 m)   Wt 136 lb (61.7 kg)   LMP 03/17/2016   SpO2 100%   Breastfeeding? Unknown   BMI 24.09 kg/m  General: alert and no distress Pulm: normal respiratory effort Lochia: appropriate Abdomen: soft, NT Uterine Fundus: firm, below umbilicus Extremities: No evidence of DVT seen on physical exam. No lower  extremity edema.   Disposition: stable, discharge to home Baby Feeding: formula Baby Disposition: home with mom  Contraception: Depo - given 1st shot in hospital  Prenatal Labs:     Plan:  Kimberly Powers was discharged to home in good condition. [facility]-up appointment at [facility] in 6 weeks  Discharge Instructions: Per After Visit Summary. Activity: Advance as tolerated. Pelvic rest for 6 weeks.  Refer to After Visit Summary Diet: Regular Discharge Medications: Allergies as of 12/14/2016   No Known Allergies     Medication List    TAKE these medications   acetaminophen 500 MG tablet Commonly known as:  TYLENOL Take 1 tablet (500 mg total) by mouth every 4 (four) hours as needed for moderate pain.   ascorbic acid 250 MG tablet Commonly known as:  VITAMIN C Take 1 tablet (250 mg total) by mouth 2 (two) times daily with a meal. Take with iron for anemia   docusate sodium 100 MG capsule Commonly known as:  COLACE Take 1 capsule (100 mg total) by mouth daily as needed for mild constipation.   ferrous sulfate 325 (65 FE) MG tablet Take 1 tablet (325 mg total) by mouth 2 (two) times daily with a meal. For anemia, take with Vitamin C   ibuprofen 800 MG tablet Commonly known as:  ADVIL,MOTRIN Take 1 tablet (800 mg total) by mouth every 8 (eight) hours as needed for moderate pain or cramping.   medroxyPROGESTERone 150 MG/ML injection Commonly known as:  DEPO-PROVERA Inject 1  mL (150 mg total) into the muscle every 3 (three) months.   multivitamin-prenatal 27-0.8 MG Tabs tablet Take 1 tablet by mouth daily at 12 noon.   ondansetron 4 MG disintegrating tablet Commonly known as:  ZOFRAN ODT Take 1 tablet (4 mg total) by mouth every 8 (eight) hours as needed for nausea or vomiting.      Outpatient follow up: with Dr. Feliberto Gottron in 6 weeks   Signed:  Christeen Douglas

## 2016-12-13 NOTE — Progress Notes (Signed)
Kimberly Powers is a 21 y.o. G1P0000 at [redacted]w[redacted]d  Subjective: Comfortable with CLE   Objective: BP 107/70   Pulse 92   Temp 97.5 F (36.4 C) (Oral)   Resp 16   Ht  (1.6 m)   Wt 136 lb (61.7 kg)   LMP 03/17/2016   SpO2 95%   BMI 24.09 kg/m  No intake/output data recorded. No intake/output data recorded.  FHT:  FHR: 130 bpm, variability: moderate,  accelerations:  Present,  decelerations:  Absent UC:   regular, every 3-5 minutes SVE:   Dilation: 8 Effacement (%): 100 Station: +1 Exam by:: Laural Benes RN Exam  AROM clear : 8 cm /c/ =1 vtx Labs: Lab Results  Component Value Date   WBC 7.6 12/13/2016   HGB 11.3 (L) 12/13/2016   HCT 33.3 (L) 12/13/2016   MCV 92.5 12/13/2016   PLT 128 (L) 12/13/2016    Assessment / Plan: Labor , reassuring well being , anticipate second stage shortly  [name] 12/13/2016, 8:49 AM

## 2016-12-13 NOTE — Progress Notes (Signed)
Called provider at [phone]. Reported vaginal exam update. Verbal orders to watch patient overnight. Verbal order for stadol IV Q2hrs prn for pain.

## 2016-12-14 LAB — URINE DRUG SCREEN, QUALITATIVE (ARMC ONLY)
Amphetamines, Ur Screen: NOT DETECTED
BARBITURATES, UR SCREEN: NOT DETECTED
BENZODIAZEPINE, UR SCRN: NOT DETECTED
CANNABINOID 50 NG, UR ~~LOC~~: NOT DETECTED
COCAINE METABOLITE, UR ~~LOC~~: NOT DETECTED
MDMA (Ecstasy)Ur Screen: NOT DETECTED
METHADONE SCREEN, URINE: NOT DETECTED
Opiate, Ur Screen: NOT DETECTED
Phencyclidine (PCP) Ur S: NOT DETECTED
TRICYCLIC, UR SCREEN: NOT DETECTED

## 2016-12-14 LAB — CBC
HCT: 29.2 % — ABNORMAL LOW (ref 35.0–47.0)
Hemoglobin: 9.9 g/dL — ABNORMAL LOW (ref 12.0–16.0)
MCH: 30.9 pg (ref 26.0–34.0)
MCHC: 33.7 g/dL (ref 32.0–36.0)
MCV: 91.7 fL (ref 80.0–100.0)
Platelets: 110 10*3/uL — ABNORMAL LOW (ref 150–440)
RBC: 3.19 MIL/uL — ABNORMAL LOW (ref 3.80–5.20)
RDW: 13.5 % (ref 11.5–14.5)
WBC: 10.1 10*3/uL (ref 3.6–11.0)

## 2016-12-14 LAB — RPR: RPR: NONREACTIVE

## 2016-12-14 MED ORDER — MEDROXYPROGESTERONE ACETATE 150 MG/ML IM SUSP
150.0000 mg | INTRAMUSCULAR | Status: DC
  Administered 2016-12-14: 150 mg via INTRAMUSCULAR
  Filled 2016-12-14: qty 1

## 2016-12-14 MED ORDER — IBUPROFEN 800 MG PO TABS: 800.0000 mg | ORAL_TABLET | Freq: Three times a day (TID) | ORAL | 0 refills | Status: AC | PRN

## 2016-12-14 MED ORDER — MEDROXYPROGESTERONE ACETATE 150 MG/ML IM SUSP: 150.0000 mg | INTRAMUSCULAR | 4 refills | Status: DC

## 2016-12-14 MED ORDER — DOCUSATE SODIUM 100 MG PO CAPS: 100.0000 mg | ORAL_CAPSULE | Freq: Every day | ORAL | 3 refills | Status: DC | PRN

## 2016-12-14 MED ORDER — FERROUS SULFATE 325 (65 FE) MG PO TABS: 325.0000 mg | ORAL_TABLET | Freq: Two times a day (BID) | ORAL | 3 refills | Status: DC

## 2016-12-14 MED ORDER — ASCORBIC ACID 250 MG PO TABS: 250.0000 mg | ORAL_TABLET | Freq: Two times a day (BID) | ORAL | 3 refills | Status: AC

## 2016-12-14 NOTE — Anesthesia Postprocedure Evaluation (Signed)
Anesthesia Post Note  Patient: Harnett CallasLacorya M Jeff  Procedure(s) Performed: * No procedures listed *  Patient location during evaluation: Mother Baby Anesthesia Type: Epidural Level of consciousness: awake and alert, awake and oriented Pain management: pain level controlled Vital Signs Assessment: post-procedure vital signs reviewed and stable Respiratory status: spontaneous breathing, nonlabored ventilation and respiratory function stable Cardiovascular status: blood pressure returned to baseline and stable Postop Assessment: no headache and no backache Anesthetic complications: no     Last Vitals:  Vitals:   12/14/16 0256 12/14/16 0740  BP: (!) 110/53 111/71  Pulse: (!) 58 (!) 58  Resp: 20 17  Temp: 36.9 C 36.8 C    Last Pain:  Vitals:   12/14/16 0740  TempSrc: Tympanic  PainSc:                  Ginger CarneStephanie Joyell Emami

## 2016-12-14 NOTE — Progress Notes (Signed)
Patient discharge to home via wheelchair with baby in car seat.
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
## 2016-12-14 NOTE — Progress Notes (Signed)
Post Partum Day 1 Subjective: no complaints, tolerating po. Shower today.   Objective: Blood pressure 132/78, pulse (!) 57, temperature 98.1 F (36.7 C), temperature source Oral, resp. rate 17, height 5\' 3"  (1.6 m), weight 136 lb (61.7 kg), last menstrual period 03/17/2016, SpO2 100 %, unknown if currently breastfeeding.  Physical Exam:  General: alert, cooperative and appears stated age Lochia: appropriate Uterine Fundus: firm DVT Evaluation: No evidence of DVT seen on physical exam.   Recent Labs  12/13/16 0130 12/14/16 0512  HGB 11.3* 9.9*  HCT 33.3* 29.2*    Assessment/Plan: Plan for discharge tomorrow  UDS negative today Iron def anemia: Continue po iron   LOS: 1 day   Kimberly DouglasBethany Kadance Powers 12/14/2016, 1:20 PM

## 2017-02-24 ENCOUNTER — Encounter: Payer: Self-pay | Admitting: Emergency Medicine

## 2017-02-24 ENCOUNTER — Emergency Department
Admission: EM | Admit: 2017-02-24 | Discharge: 2017-02-24 | Disposition: A | Discharge status: Home / Self Care | Payer: Medicaid Other | Attending: Emergency Medicine | Admitting: Emergency Medicine

## 2017-02-24 DIAGNOSIS — Z79899 Other long term (current) drug therapy: Secondary | ICD-10-CM | POA: Diagnosis not present

## 2017-02-24 DIAGNOSIS — Z202 Contact with and (suspected) exposure to infections with a predominantly sexual mode of transmission: Secondary | ICD-10-CM | POA: Diagnosis not present

## 2017-02-24 DIAGNOSIS — F17210 Nicotine dependence, cigarettes, uncomplicated: Secondary | ICD-10-CM | POA: Diagnosis not present

## 2017-02-24 LAB — WET PREP, GENITAL
CLUE CELLS WET PREP: NONE SEEN
Sperm: NONE SEEN
Trich, Wet Prep: NONE SEEN
Yeast Wet Prep HPF POC: NONE SEEN

## 2017-02-24 MED ORDER — AZITHROMYCIN 500 MG PO TABS
1000.0000 mg | ORAL_TABLET | Freq: Once | ORAL | Status: AC
  Administered 2017-02-24: 1000 mg via ORAL
  Filled 2017-02-24: qty 2

## 2017-02-24 MED ORDER — CEFTRIAXONE SODIUM 250 MG IJ SOLR
250.0000 mg | Freq: Once | INTRAMUSCULAR | Status: AC
  Administered 2017-02-24: 250 mg via INTRAMUSCULAR
  Filled 2017-02-24: qty 250
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
## 2017-02-24 NOTE — ED Triage Notes (Signed)
Patient states that she received a phone call today saying that she has been exposed to gonorrhea and chlamydia and that she would like to be checked.

## 2017-02-25 LAB — CHLAMYDIA/NGC RT PCR (ARMC ONLY)
Chlamydia Tr: NOT DETECTED
N GONORRHOEAE: DETECTED — AB

## 2017-02-26 ENCOUNTER — Telehealth: Payer: Self-pay | Admitting: Emergency Medicine

## 2017-02-26 NOTE — Telephone Encounter (Signed)
Called patient and informed her of std results.  She was treated during ED visit.

## 2017-06-06 ENCOUNTER — Encounter: Payer: Self-pay | Admitting: Emergency Medicine

## 2017-06-06 ENCOUNTER — Emergency Department
Admission: EM | Admit: 2017-06-06 | Discharge: 2017-06-06 | Disposition: A | Discharge status: Home / Self Care | Payer: Medicaid Other | Attending: Emergency Medicine | Admitting: Emergency Medicine

## 2017-06-06 DIAGNOSIS — Z202 Contact with and (suspected) exposure to infections with a predominantly sexual mode of transmission: Secondary | ICD-10-CM | POA: Insufficient documentation

## 2017-06-06 DIAGNOSIS — N898 Other specified noninflammatory disorders of vagina: Secondary | ICD-10-CM | POA: Insufficient documentation

## 2017-06-06 DIAGNOSIS — Z79899 Other long term (current) drug therapy: Secondary | ICD-10-CM | POA: Diagnosis not present

## 2017-06-06 DIAGNOSIS — F17210 Nicotine dependence, cigarettes, uncomplicated: Secondary | ICD-10-CM | POA: Insufficient documentation

## 2017-06-06 LAB — WET PREP, GENITAL
Clue Cells Wet Prep HPF POC: NONE SEEN
Sperm: NONE SEEN
Trich, Wet Prep: NONE SEEN
Yeast Wet Prep HPF POC: NONE SEEN

## 2017-06-06 LAB — URINALYSIS, COMPLETE (UACMP) WITH MICROSCOPIC
Bilirubin Urine: NEGATIVE
GLUCOSE, UA: NEGATIVE mg/dL
KETONES UR: NEGATIVE mg/dL
Nitrite: NEGATIVE
PROTEIN: 30 mg/dL — AB
Specific Gravity, Urine: 1.027 (ref 1.005–1.030)
pH: 6 (ref 5.0–8.0)

## 2017-06-06 LAB — CHLAMYDIA/NGC RT PCR (ARMC ONLY)
CHLAMYDIA TR: NOT DETECTED
N GONORRHOEAE: NOT DETECTED

## 2017-06-06 LAB — PREGNANCY, URINE: Preg Test, Ur: NEGATIVE

## 2017-06-06 MED ORDER — AZITHROMYCIN 500 MG PO TABS
1000.0000 mg | ORAL_TABLET | Freq: Once | ORAL | Status: AC
  Administered 2017-06-06: 1000 mg via ORAL
  Filled 2017-06-06: qty 2

## 2017-06-06 MED ORDER — CEFTRIAXONE SODIUM 1 G IJ SOLR
500.0000 mg | Freq: Once | INTRAMUSCULAR | Status: AC
  Administered 2017-06-06: 500 mg via INTRAMUSCULAR
  Filled 2017-06-06: qty 10

## 2017-06-06 MED ORDER — LIDOCAINE HCL (PF) 1 % IJ SOLN
INTRAMUSCULAR | Status: AC
  Administered 2017-06-06: 2.1 mL
  Filled 2017-06-06: qty 5
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
## 2017-06-06 NOTE — ED Notes (Addendum)
Pt states that her partner called to let her know he had a sexually transmitted disease. Family at bedside. Partner did not let her know what it was. Pt states she has no symptoms but she did have a yeast infection.

## 2017-06-06 NOTE — ED Triage Notes (Signed)
Pt states needs to be checked for STD.  States vaginal itching and discharge since yesterday.

## 2017-06-06 NOTE — Discharge Instructions (Signed)
Please follow-up with health department 1 week for recheck.  Refrain from sexual activity for 1 week until after follow-up with health department.  Return to the ER for any pain fevers worsening symptoms or to changes in her health.

## 2017-06-06 NOTE — ED Notes (Addendum)
POC preg  Negative  

## 2017-06-15 ENCOUNTER — Emergency Department
Admission: EM | Admit: 2017-06-15 | Discharge: 2017-06-15 | Disposition: A | Discharge status: Home / Self Care | Payer: Medicaid Other | Attending: Emergency Medicine | Admitting: Emergency Medicine

## 2017-06-15 ENCOUNTER — Encounter: Payer: Self-pay | Admitting: Emergency Medicine

## 2017-06-15 DIAGNOSIS — Z79899 Other long term (current) drug therapy: Secondary | ICD-10-CM | POA: Insufficient documentation

## 2017-06-15 DIAGNOSIS — B09 Unspecified viral infection characterized by skin and mucous membrane lesions: Secondary | ICD-10-CM | POA: Diagnosis not present

## 2017-06-15 DIAGNOSIS — R21 Rash and other nonspecific skin eruption: Secondary | ICD-10-CM | POA: Insufficient documentation

## 2017-06-15 DIAGNOSIS — F17210 Nicotine dependence, cigarettes, uncomplicated: Secondary | ICD-10-CM | POA: Diagnosis not present

## 2017-06-15 NOTE — ED Notes (Signed)
Pt c/o rash to upper lip that started this morning, states it feels "like it's raw". No blistering noted to lip by this RN. Pt also states was treated approx 1 week ago for gonorrhea and chlamydia and would like to follow up for that as her PCP does not have any appts.
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
## 2017-06-15 NOTE — ED Triage Notes (Signed)
Pt here for rash on lips that started this morning.  Denies pain. C/o it being irritating.  No swelling. No blisters.  Also wants to be rechecked for STD. Was unable to FU with doctor so wants to make sure it resolved.

## 2017-06-15 NOTE — Discharge Instructions (Signed)
Follow-up with your doctor at [facility] or Health Department if any further concerns. Increase fluids.

## 2017-06-15 NOTE — ED Notes (Signed)
NAD noted at time of D/C. Pt denies questions or concerns. Pt ambulatory to the lobby at this time.  

## 2017-07-02 ENCOUNTER — Emergency Department
Admission: EM | Admit: 2017-07-02 | Discharge: 2017-07-02 | Disposition: A | Discharge status: Home / Self Care | Payer: Medicaid Other | Attending: Emergency Medicine | Admitting: Emergency Medicine

## 2017-07-02 ENCOUNTER — Emergency Department
Admission: EM | Admit: 2017-07-02 | Discharge: 2017-07-02 | Disposition: A | Discharge status: Home / Self Care | Payer: Medicaid Other | Source: Home / Self Care | Attending: Emergency Medicine | Admitting: Emergency Medicine

## 2017-07-02 ENCOUNTER — Emergency Department: Payer: Medicaid Other

## 2017-07-02 ENCOUNTER — Encounter: Payer: Self-pay | Admitting: Emergency Medicine

## 2017-07-02 DIAGNOSIS — F17210 Nicotine dependence, cigarettes, uncomplicated: Secondary | ICD-10-CM | POA: Diagnosis not present

## 2017-07-02 DIAGNOSIS — Z79899 Other long term (current) drug therapy: Secondary | ICD-10-CM | POA: Diagnosis not present

## 2017-07-02 DIAGNOSIS — R51 Headache: Secondary | ICD-10-CM | POA: Diagnosis present

## 2017-07-02 DIAGNOSIS — R55 Syncope and collapse: Secondary | ICD-10-CM

## 2017-07-02 LAB — BASIC METABOLIC PANEL
Anion gap: 8 (ref 5–15)
BUN: 8 mg/dL (ref 6–20)
CO2: 23 mmol/L (ref 22–32)
CREATININE: 0.72 mg/dL (ref 0.44–1.00)
Calcium: 9.7 mg/dL (ref 8.9–10.3)
Chloride: 106 mmol/L (ref 101–111)
GFR calc Af Amer: >60 mL/min (ref >60)
Glucose, Bld: 63 mg/dL — ABNORMAL LOW (ref 65–99)
POTASSIUM: 3.5 mmol/L (ref 3.5–5.1)
SODIUM: 137 mmol/L (ref 135–145)

## 2017-07-02 LAB — CBC
HEMATOCRIT: 41.8 % (ref 35.0–47.0)
Hemoglobin: 14 g/dL (ref 12.0–16.0)
MCH: 32.1 pg (ref 26.0–34.0)
MCHC: 33.6 g/dL (ref 32.0–36.0)
MCV: 95.7 fL (ref 80.0–100.0)
PLATELETS: 164 10*3/uL (ref 150–440)
RBC: 4.37 MIL/uL (ref 3.80–5.20)
RDW: 13 % (ref 11.5–14.5)
WBC: 4.1 10*3/uL (ref 3.6–11.0)

## 2017-07-02 LAB — POCT PREGNANCY, URINE: PREG TEST UR: NEGATIVE

## 2017-07-02 LAB — TROPONIN I: Troponin I: <0.03 ng/mL (ref <0.03)

## 2017-07-02 NOTE — ED Notes (Signed)
NAD noted at time of D/C. Pt denies questions or concerns. Pt ambulatory to the lobby at this time.  

## 2017-07-02 NOTE — ED Triage Notes (Signed)
Pt in via POV with complaints of syncopal episode this morning while at work.  Pt reports incident was witnessed, denies hitting head.  Pt reports headache, blurred vision, chest pain since the incident.  Pt was here earlier today but left prior to be seen.  Pt ambulatory to triage without difficulty.  Vitals WDL, NAD noted at this time.

## 2017-07-02 NOTE — ED Triage Notes (Signed)
States chest pain and a headache that began this AM, states feeling [place] work, states deep breathes make the pain worse

## 2017-07-02 NOTE — ED Notes (Addendum)
Pt with blood work and EKG performed at initial visit today, no new complaints; see previous results.
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
## 2017-07-02 NOTE — Discharge Instructions (Signed)
You have been seen in the emergency department today for chest pain and near syncope. Your workup has shown normal results. As we discussed please follow-up with your primary care physician in the next 1-2 days for recheck. Return to the emergency department for any further chest pain, trouble breathing, or any other symptom personally concerning to yourself.

## 2017-07-10 ENCOUNTER — Encounter: Payer: Self-pay | Admitting: Emergency Medicine

## 2017-07-10 ENCOUNTER — Emergency Department
Admission: EM | Admit: 2017-07-10 | Discharge: 2017-07-10 | Disposition: A | Discharge status: Home / Self Care | Payer: Medicaid Other | Attending: Emergency Medicine | Admitting: Emergency Medicine

## 2017-07-10 DIAGNOSIS — N898 Other specified noninflammatory disorders of vagina: Secondary | ICD-10-CM | POA: Diagnosis not present

## 2017-07-10 DIAGNOSIS — Z5321 Procedure and treatment not carried out due to patient leaving prior to being seen by health care provider: Secondary | ICD-10-CM | POA: Diagnosis not present

## 2017-07-10 NOTE — ED Notes (Signed)
Pt called from lobby X3 with no reply.  unable to locate pt in waiting room.

## 2017-07-10 NOTE — ED Triage Notes (Signed)
Pt c/o vaginal itching, white discharge as well as irritation x2 days. Pt reports having multiple yeast infections in last year.

## 2017-09-03 ENCOUNTER — Emergency Department
Admission: EM | Admit: 2017-09-03 | Discharge: 2017-09-03 | Disposition: A | Discharge status: Home / Self Care | Payer: Medicaid Other | Attending: Emergency Medicine | Admitting: Emergency Medicine

## 2017-09-03 ENCOUNTER — Encounter: Payer: Self-pay | Admitting: Emergency Medicine

## 2017-09-03 ENCOUNTER — Other Ambulatory Visit: Payer: Self-pay

## 2017-09-03 DIAGNOSIS — Z79899 Other long term (current) drug therapy: Secondary | ICD-10-CM | POA: Insufficient documentation

## 2017-09-03 DIAGNOSIS — N760 Acute vaginitis: Secondary | ICD-10-CM | POA: Diagnosis not present

## 2017-09-03 DIAGNOSIS — R1030 Lower abdominal pain, unspecified: Secondary | ICD-10-CM | POA: Insufficient documentation

## 2017-09-03 DIAGNOSIS — B9689 Other specified bacterial agents as the cause of diseases classified elsewhere: Secondary | ICD-10-CM | POA: Insufficient documentation

## 2017-09-03 DIAGNOSIS — F17210 Nicotine dependence, cigarettes, uncomplicated: Secondary | ICD-10-CM | POA: Insufficient documentation

## 2017-09-03 DIAGNOSIS — N898 Other specified noninflammatory disorders of vagina: Secondary | ICD-10-CM | POA: Diagnosis present

## 2017-09-03 LAB — COMPREHENSIVE METABOLIC PANEL
ALBUMIN: 4.5 g/dL (ref 3.5–5.0)
ALK PHOS: 56 U/L (ref 38–126)
ALT: 17 U/L (ref 14–54)
AST: 22 U/L (ref 15–41)
Anion gap: 8 (ref 5–15)
BUN: 17 mg/dL (ref 6–20)
CALCIUM: 9.6 mg/dL (ref 8.9–10.3)
CHLORIDE: 106 mmol/L (ref 101–111)
CO2: 25 mmol/L (ref 22–32)
CREATININE: 0.79 mg/dL (ref 0.44–1.00)
GFR calc non Af Amer: >60 mL/min (ref >60)
GLUCOSE: 74 mg/dL (ref 65–99)
Potassium: 3.8 mmol/L (ref 3.5–5.1)
SODIUM: 139 mmol/L (ref 135–145)
Total Bilirubin: 1.1 mg/dL (ref 0.3–1.2)
Total Protein: 7.6 g/dL (ref 6.5–8.1)

## 2017-09-03 LAB — WET PREP, GENITAL
SPERM: NONE SEEN
TRICH WET PREP: NONE SEEN
Yeast Wet Prep HPF POC: NONE SEEN

## 2017-09-03 LAB — CBC
HCT: 40.9 % (ref 35.0–47.0)
HEMOGLOBIN: 13.8 g/dL (ref 12.0–16.0)
MCH: 32.4 pg (ref 26.0–34.0)
MCHC: 33.7 g/dL (ref 32.0–36.0)
MCV: 96.2 fL (ref 80.0–100.0)
PLATELETS: 189 10*3/uL (ref 150–440)
RBC: 4.25 MIL/uL (ref 3.80–5.20)
RDW: 13.3 % (ref 11.5–14.5)
WBC: 4 10*3/uL (ref 3.6–11.0)

## 2017-09-03 LAB — URINALYSIS, COMPLETE (UACMP) WITH MICROSCOPIC
Bacteria, UA: NONE SEEN
Bilirubin Urine: NEGATIVE
GLUCOSE, UA: NEGATIVE mg/dL
HGB URINE DIPSTICK: NEGATIVE
KETONES UR: NEGATIVE mg/dL
LEUKOCYTES UA: NEGATIVE
NITRITE: NEGATIVE
PH: 5 (ref 5.0–8.0)
PROTEIN: NEGATIVE mg/dL
Specific Gravity, Urine: 1.025 (ref 1.005–1.030)

## 2017-09-03 LAB — POCT PREGNANCY, URINE: Preg Test, Ur: NEGATIVE

## 2017-09-03 LAB — CHLAMYDIA/NGC RT PCR (ARMC ONLY)
CHLAMYDIA TR: NOT DETECTED
N gonorrhoeae: NOT DETECTED

## 2017-09-03 MED ORDER — METRONIDAZOLE 500 MG PO TABS: 500.0000 mg | ORAL_TABLET | Freq: Two times a day (BID) | ORAL | 0 refills | Status: AC

## 2017-09-03 NOTE — Discharge Instructions (Signed)
please take the Flagyl one pill twice a day. that should treat the bacterial vaginosis. The other cultures will come back in about 2 days. Right now everything looks essentially normal.urine did not show any signs of infection. please return or follow up with your doctor or the health department if you're having any more pain with the discharge does not resolve or you have any other problems.

## 2017-09-03 NOTE — ED Triage Notes (Signed)
Itchy vaginal discharge. Began one week ago. Lower abdominal pain with intercourse 2 nights ago.
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
## 2017-09-03 NOTE — ED Notes (Signed)
Pt ambulatory upon discharge. Verbalized understanding of discharge instructions, follow-up care and prescription. VSS. Skin warm and dry. A&O x4.  

## 2017-09-04 ENCOUNTER — Telehealth: Payer: Self-pay | Admitting: Emergency Medicine

## 2017-09-04 NOTE — Telephone Encounter (Signed)
Patient called asking for test results.  Gave her results for gc/chlamydia test.

## 2017-11-15 ENCOUNTER — Emergency Department
Admission: EM | Admit: 2017-11-15 | Discharge: 2017-11-15 | Disposition: A | Discharge status: Home / Self Care | Payer: Medicaid Other | Attending: Emergency Medicine | Admitting: Emergency Medicine

## 2017-11-15 ENCOUNTER — Other Ambulatory Visit: Payer: Self-pay

## 2017-11-15 DIAGNOSIS — M79604 Pain in right leg: Secondary | ICD-10-CM | POA: Diagnosis not present

## 2017-11-15 DIAGNOSIS — Y999 Unspecified external cause status: Secondary | ICD-10-CM | POA: Diagnosis not present

## 2017-11-15 DIAGNOSIS — Z79899 Other long term (current) drug therapy: Secondary | ICD-10-CM | POA: Insufficient documentation

## 2017-11-15 DIAGNOSIS — F17210 Nicotine dependence, cigarettes, uncomplicated: Secondary | ICD-10-CM | POA: Insufficient documentation

## 2017-11-15 DIAGNOSIS — S3992XA Unspecified injury of lower back, initial encounter: Secondary | ICD-10-CM | POA: Diagnosis present

## 2017-11-15 DIAGNOSIS — Y939 Activity, unspecified: Secondary | ICD-10-CM | POA: Insufficient documentation

## 2017-11-15 DIAGNOSIS — S39012A Strain of muscle, fascia and tendon of lower back, initial encounter: Secondary | ICD-10-CM | POA: Diagnosis not present

## 2017-11-15 DIAGNOSIS — Y929 Unspecified place or not applicable: Secondary | ICD-10-CM | POA: Diagnosis not present

## 2017-11-15 DIAGNOSIS — M7918 Myalgia, other site: Secondary | ICD-10-CM

## 2017-11-15 MED ORDER — IBUPROFEN 600 MG PO TABS: 600.0000 mg | ORAL_TABLET | Freq: Three times a day (TID) | ORAL | 0 refills | Status: DC | PRN

## 2017-11-15 MED ORDER — TRAMADOL HCL 50 MG PO TABS: 50.0000 mg | ORAL_TABLET | Freq: Two times a day (BID) | ORAL | 0 refills | Status: DC | PRN

## 2017-11-15 MED ORDER — CYCLOBENZAPRINE HCL 10 MG PO TABS: 10.0000 mg | ORAL_TABLET | Freq: Three times a day (TID) | ORAL | 0 refills | Status: DC | PRN
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
## 2017-11-15 NOTE — ED Notes (Signed)
FIRST NURSE NOTE: pt comes into the ED via EMS from home, states pt was suppose to go for a routine visit to her PCP today and told her son that she just didn't feel up to going and states "Its my breathing" pt is in NAD on arrival, EMS reports O2 satd 98% on RA, b/p 118/80, CBG 194, son reported pt has not been taking her daily meds since December.
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
## 2017-11-15 NOTE — ED Notes (Signed)
Pt verbalized understanding of discharge instructions. NAD at this time. 

## 2018-01-01 ENCOUNTER — Telehealth: Payer: Self-pay | Admitting: Obstetrics & Gynecology

## 2018-01-01 NOTE — Telephone Encounter (Signed)
[name] Referring for Vaginitis. Unable to reach patient due to calling restrictions. Unable to leave message for patient to call to schedule

## 2018-01-02 NOTE — Telephone Encounter (Signed)
Unable to reach patient due to calling restrictions

## 2018-02-19 ENCOUNTER — Ambulatory Visit: Payer: Self-pay | Admitting: Obstetrics and Gynecology

## 2018-05-07 ENCOUNTER — Emergency Department
Admission: EM | Admit: 2018-05-07 | Discharge: 2018-05-07 | Disposition: A | Discharge status: Home / Self Care | Payer: Medicaid Other | Attending: Emergency Medicine | Admitting: Emergency Medicine

## 2018-05-07 ENCOUNTER — Encounter: Payer: Self-pay | Admitting: Emergency Medicine

## 2018-05-07 ENCOUNTER — Other Ambulatory Visit: Payer: Self-pay

## 2018-05-07 DIAGNOSIS — F17210 Nicotine dependence, cigarettes, uncomplicated: Secondary | ICD-10-CM | POA: Diagnosis not present

## 2018-05-07 DIAGNOSIS — N898 Other specified noninflammatory disorders of vagina: Secondary | ICD-10-CM | POA: Diagnosis present

## 2018-05-07 DIAGNOSIS — B9689 Other specified bacterial agents as the cause of diseases classified elsewhere: Secondary | ICD-10-CM | POA: Diagnosis not present

## 2018-05-07 DIAGNOSIS — N760 Acute vaginitis: Secondary | ICD-10-CM | POA: Diagnosis not present

## 2018-05-07 DIAGNOSIS — Z79899 Other long term (current) drug therapy: Secondary | ICD-10-CM | POA: Insufficient documentation

## 2018-05-07 DIAGNOSIS — N3001 Acute cystitis with hematuria: Secondary | ICD-10-CM | POA: Diagnosis not present

## 2018-05-07 DIAGNOSIS — A599 Trichomoniasis, unspecified: Secondary | ICD-10-CM

## 2018-05-07 LAB — URINALYSIS, COMPLETE (UACMP) WITH MICROSCOPIC
Bilirubin Urine: NEGATIVE
Glucose, UA: NEGATIVE mg/dL
KETONES UR: NEGATIVE mg/dL
Nitrite: NEGATIVE
PH: 5 (ref 5.0–8.0)
Protein, ur: 30 mg/dL — AB
Specific Gravity, Urine: 1.02 (ref 1.005–1.030)
WBC, UA: >50 WBC/hpf — ABNORMAL HIGH (ref 0–5)

## 2018-05-07 LAB — CHLAMYDIA/NGC RT PCR (ARMC ONLY)
Chlamydia Tr: NOT DETECTED
N gonorrhoeae: NOT DETECTED

## 2018-05-07 LAB — WET PREP, GENITAL
SPERM: NONE SEEN
YEAST WET PREP: NONE SEEN

## 2018-05-07 LAB — POCT PREGNANCY, URINE: Preg Test, Ur: NEGATIVE

## 2018-05-07 MED ORDER — CEPHALEXIN 500 MG PO CAPS: 500.0000 mg | ORAL_CAPSULE | Freq: Three times a day (TID) | ORAL | 0 refills | Status: AC

## 2018-05-07 MED ORDER — AZITHROMYCIN 500 MG PO TABS
1000.0000 mg | ORAL_TABLET | Freq: Once | ORAL | Status: AC
  Administered 2018-05-07: 1000 mg via ORAL
  Filled 2018-05-07: qty 2

## 2018-05-07 MED ORDER — CEFTRIAXONE SODIUM 1 G IJ SOLR
1.0000 g | Freq: Once | INTRAMUSCULAR | Status: AC
  Administered 2018-05-07: 1 g via INTRAMUSCULAR
  Filled 2018-05-07: qty 10

## 2018-05-07 MED ORDER — METRONIDAZOLE 500 MG PO TABS
2000.0000 mg | ORAL_TABLET | Freq: Once | ORAL | Status: AC
  Administered 2018-05-07: 2000 mg via ORAL
  Filled 2018-05-07: qty 4

## 2018-05-07 MED ORDER — METRONIDAZOLE 500 MG PO TABS: 500.0000 mg | ORAL_TABLET | Freq: Two times a day (BID) | ORAL | 0 refills | Status: AC

## 2018-05-07 NOTE — ED Triage Notes (Addendum)
Patient reports white vaginal discharge for several weeks. States "every test I've had comes up negative." When questioned up type of test, patient is unsure. Patient reports last night she started having lower abdominal and back pain. Denies any new sexual partners but states she is sexually active. Reports pain worse with urination.

## 2018-05-07 NOTE — ED Notes (Signed)
Says vaginal discharge and dysurea.  Was worried about a uti. Says sharp lower abd pain pelvic.  Not one sided.  Pt in nad.
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
## 2018-05-30 ENCOUNTER — Encounter: Payer: Self-pay | Admitting: Emergency Medicine

## 2018-05-30 ENCOUNTER — Emergency Department
Admission: EM | Admit: 2018-05-30 | Discharge: 2018-05-30 | Disposition: A | Discharge status: Home / Self Care | Payer: Medicaid Other | Attending: Emergency Medicine | Admitting: Emergency Medicine

## 2018-05-30 DIAGNOSIS — N760 Acute vaginitis: Secondary | ICD-10-CM | POA: Insufficient documentation

## 2018-05-30 DIAGNOSIS — Z79899 Other long term (current) drug therapy: Secondary | ICD-10-CM | POA: Insufficient documentation

## 2018-05-30 DIAGNOSIS — N898 Other specified noninflammatory disorders of vagina: Secondary | ICD-10-CM | POA: Diagnosis present

## 2018-05-30 DIAGNOSIS — F17210 Nicotine dependence, cigarettes, uncomplicated: Secondary | ICD-10-CM | POA: Diagnosis not present

## 2018-05-30 DIAGNOSIS — B9689 Other specified bacterial agents as the cause of diseases classified elsewhere: Secondary | ICD-10-CM

## 2018-05-30 LAB — URINALYSIS, COMPLETE (UACMP) WITH MICROSCOPIC
BACTERIA UA: NONE SEEN
BILIRUBIN URINE: NEGATIVE
Glucose, UA: NEGATIVE mg/dL
KETONES UR: NEGATIVE mg/dL
LEUKOCYTES UA: NEGATIVE
NITRITE: NEGATIVE
Protein, ur: NEGATIVE mg/dL
Specific Gravity, Urine: 1.001 — ABNORMAL LOW (ref 1.005–1.030)
pH: 7 (ref 5.0–8.0)

## 2018-05-30 LAB — CHLAMYDIA/NGC RT PCR (ARMC ONLY)
Chlamydia Tr: NOT DETECTED
N GONORRHOEAE: NOT DETECTED

## 2018-05-30 LAB — WET PREP, GENITAL
SPERM: NONE SEEN
TRICH WET PREP: NONE SEEN
YEAST WET PREP: NONE SEEN

## 2018-05-30 MED ORDER — METRONIDAZOLE 0.75 % VA GEL: 1.0000 | Freq: Every day | VAGINAL | 0 refills | Status: AC
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
## 2018-05-30 NOTE — ED Triage Notes (Signed)
Pt recently treated for STD and was concerned about possible recurrent infection. Pt reports abnormal colored discharge

## 2018-05-30 NOTE — Discharge Instructions (Signed)
You are being treated for a BV infection. Use the vaginal gel with the applicator into the vagina, as directed. Avoid any sexual contact until treatment is completed. Follow-up with [facility] for further treatment.

## 2018-06-15 ENCOUNTER — Encounter: Payer: Self-pay | Admitting: Emergency Medicine

## 2018-06-15 ENCOUNTER — Emergency Department: Payer: Self-pay

## 2018-06-15 ENCOUNTER — Emergency Department
Admission: EM | Admit: 2018-06-15 | Discharge: 2018-06-15 | Disposition: A | Discharge status: Home / Self Care | Payer: Self-pay | Attending: Emergency Medicine | Admitting: Emergency Medicine

## 2018-06-15 ENCOUNTER — Other Ambulatory Visit: Payer: Self-pay

## 2018-06-15 DIAGNOSIS — R1030 Lower abdominal pain, unspecified: Secondary | ICD-10-CM | POA: Insufficient documentation

## 2018-06-15 DIAGNOSIS — F17210 Nicotine dependence, cigarettes, uncomplicated: Secondary | ICD-10-CM | POA: Insufficient documentation

## 2018-06-15 DIAGNOSIS — F1210 Cannabis abuse, uncomplicated: Secondary | ICD-10-CM | POA: Insufficient documentation

## 2018-06-15 DIAGNOSIS — R1032 Left lower quadrant pain: Secondary | ICD-10-CM | POA: Insufficient documentation

## 2018-06-15 DIAGNOSIS — N898 Other specified noninflammatory disorders of vagina: Secondary | ICD-10-CM | POA: Insufficient documentation

## 2018-06-15 LAB — URINALYSIS, COMPLETE (UACMP) WITH MICROSCOPIC
Bacteria, UA: NONE SEEN
Bilirubin Urine: NEGATIVE
Glucose, UA: NEGATIVE mg/dL
HGB URINE DIPSTICK: NEGATIVE
Ketones, ur: NEGATIVE mg/dL
NITRITE: NEGATIVE
Protein, ur: NEGATIVE mg/dL
Specific Gravity, Urine: 1.028 (ref 1.005–1.030)
pH: 5 (ref 5.0–8.0)

## 2018-06-15 LAB — COMPREHENSIVE METABOLIC PANEL
ALBUMIN: 4 g/dL (ref 3.5–5.0)
ALT: 12 U/L (ref 0–44)
ANION GAP: 6 (ref 5–15)
AST: 20 U/L (ref 15–41)
Alkaline Phosphatase: 48 U/L (ref 38–126)
BILIRUBIN TOTAL: 0.5 mg/dL (ref 0.3–1.2)
BUN: 16 mg/dL (ref 6–20)
CHLORIDE: 105 mmol/L (ref 98–111)
CO2: 27 mmol/L (ref 22–32)
Calcium: 9.1 mg/dL (ref 8.9–10.3)
Creatinine, Ser: 0.65 mg/dL (ref 0.44–1.00)
GFR calc Af Amer: >60 mL/min (ref >60)
Glucose, Bld: 85 mg/dL (ref 70–99)
POTASSIUM: 4 mmol/L (ref 3.5–5.1)
Sodium: 138 mmol/L (ref 135–145)
TOTAL PROTEIN: 6.9 g/dL (ref 6.5–8.1)

## 2018-06-15 LAB — CBC
HEMATOCRIT: 39.5 % (ref 36.0–46.0)
HEMOGLOBIN: 13.3 g/dL (ref 12.0–15.0)
MCH: 32.8 pg (ref 26.0–34.0)
MCHC: 33.7 g/dL (ref 30.0–36.0)
MCV: 97.5 fL (ref 80.0–100.0)
PLATELETS: 150 10*3/uL (ref 150–400)
RBC: 4.05 MIL/uL (ref 3.87–5.11)
RDW: 11.7 % (ref 11.5–15.5)
WBC: 3.5 10*3/uL — AB (ref 4.0–10.5)
nRBC: 0 % (ref 0.0–0.2)

## 2018-06-15 LAB — CHLAMYDIA/NGC RT PCR (ARMC ONLY)
CHLAMYDIA TR: NOT DETECTED
N GONORRHOEAE: NOT DETECTED

## 2018-06-15 LAB — POCT PREGNANCY, URINE: Preg Test, Ur: NEGATIVE

## 2018-06-15 LAB — WET PREP, GENITAL
Clue Cells Wet Prep HPF POC: NONE SEEN
Sperm: NONE SEEN
Trich, Wet Prep: NONE SEEN
YEAST WET PREP: NONE SEEN

## 2018-06-15 LAB — LIPASE, BLOOD: LIPASE: 42 U/L (ref 11–51)

## 2018-06-15 NOTE — Discharge Instructions (Addendum)
You are evaluated for left-sided lower abdominal pain, and although no certain cause was found, your exam and evaluation are overall reassuring in the emerge department today.  Return to the emergency department immediately for any worsening condition including new or worsening or uncontrolled pain, black or bloody stools, vomiting blood, or any other symptoms concerning to you.

## 2018-06-15 NOTE — ED Notes (Signed)
Pt reports that she was seen in the ED and treated for BV, trich, and a UTI - she was then seen by the PCP and told the trich had cleared but extended her atb for the BV and UTI - she reports that she is still have lower abd and lower back pain (currently still on atb) - she also reports that she could be pregnant despite having a period in October
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
## 2018-06-15 NOTE — ED Triage Notes (Signed)
Lower back and abdominal pain x 2 days. Recently treated for UTI.

## 2018-07-27 ENCOUNTER — Emergency Department
Admission: EM | Admit: 2018-07-27 | Discharge: 2018-07-27 | Disposition: A | Discharge status: Home / Self Care | Payer: Medicaid Other | Attending: Emergency Medicine | Admitting: Emergency Medicine

## 2018-07-27 ENCOUNTER — Encounter: Payer: Self-pay | Admitting: Emergency Medicine

## 2018-07-27 ENCOUNTER — Other Ambulatory Visit: Payer: Self-pay

## 2018-07-27 ENCOUNTER — Emergency Department: Payer: Medicaid Other

## 2018-07-27 DIAGNOSIS — Z79899 Other long term (current) drug therapy: Secondary | ICD-10-CM | POA: Diagnosis not present

## 2018-07-27 DIAGNOSIS — O468X1 Other antepartum hemorrhage, first trimester: Secondary | ICD-10-CM

## 2018-07-27 DIAGNOSIS — R109 Unspecified abdominal pain: Secondary | ICD-10-CM

## 2018-07-27 DIAGNOSIS — O208 Other hemorrhage in early pregnancy: Secondary | ICD-10-CM | POA: Insufficient documentation

## 2018-07-27 DIAGNOSIS — O9989 Other specified diseases and conditions complicating pregnancy, childbirth and the puerperium: Secondary | ICD-10-CM | POA: Diagnosis present

## 2018-07-27 DIAGNOSIS — O26899 Other specified pregnancy related conditions, unspecified trimester: Secondary | ICD-10-CM

## 2018-07-27 DIAGNOSIS — Z3A09 9 weeks gestation of pregnancy: Secondary | ICD-10-CM | POA: Diagnosis not present

## 2018-07-27 DIAGNOSIS — B3731 Acute candidiasis of vulva and vagina: Secondary | ICD-10-CM

## 2018-07-27 DIAGNOSIS — F17210 Nicotine dependence, cigarettes, uncomplicated: Secondary | ICD-10-CM | POA: Diagnosis not present

## 2018-07-27 DIAGNOSIS — N760 Acute vaginitis: Secondary | ICD-10-CM | POA: Insufficient documentation

## 2018-07-27 DIAGNOSIS — O418X10 Other specified disorders of amniotic fluid and membranes, first trimester, not applicable or unspecified: Secondary | ICD-10-CM

## 2018-07-27 DIAGNOSIS — R1084 Generalized abdominal pain: Secondary | ICD-10-CM | POA: Diagnosis not present

## 2018-07-27 DIAGNOSIS — B373 Candidiasis of vulva and vagina: Secondary | ICD-10-CM | POA: Diagnosis not present

## 2018-07-27 DIAGNOSIS — B9689 Other specified bacterial agents as the cause of diseases classified elsewhere: Secondary | ICD-10-CM

## 2018-07-27 LAB — URINALYSIS, COMPLETE (UACMP) WITH MICROSCOPIC
BACTERIA UA: NONE SEEN
Bilirubin Urine: NEGATIVE
Glucose, UA: NEGATIVE mg/dL
Hgb urine dipstick: NEGATIVE
Ketones, ur: 5 mg/dL — AB
LEUKOCYTES UA: NEGATIVE
NITRITE: NEGATIVE
PROTEIN: NEGATIVE mg/dL
SPECIFIC GRAVITY, URINE: 1.032 — AB (ref 1.005–1.030)
pH: 6 (ref 5.0–8.0)

## 2018-07-27 LAB — CHLAMYDIA/NGC RT PCR (ARMC ONLY)
Chlamydia Tr: NOT DETECTED
N GONORRHOEAE: NOT DETECTED

## 2018-07-27 LAB — COMPREHENSIVE METABOLIC PANEL
ALBUMIN: 4 g/dL (ref 3.5–5.0)
ALT: 16 U/L (ref 0–44)
AST: 22 U/L (ref 15–41)
Alkaline Phosphatase: 47 U/L (ref 38–126)
Anion gap: 8 (ref 5–15)
BUN: 11 mg/dL (ref 6–20)
CHLORIDE: 105 mmol/L (ref 98–111)
CO2: 20 mmol/L — AB (ref 22–32)
Calcium: 8.9 mg/dL (ref 8.9–10.3)
Creatinine, Ser: 0.46 mg/dL (ref 0.44–1.00)
GFR calc Af Amer: >60 mL/min (ref >60)
GFR calc non Af Amer: >60 mL/min (ref >60)
Glucose, Bld: 123 mg/dL — ABNORMAL HIGH (ref 70–99)
POTASSIUM: 3.3 mmol/L — AB (ref 3.5–5.1)
SODIUM: 133 mmol/L — AB (ref 135–145)
Total Bilirubin: 0.6 mg/dL (ref 0.3–1.2)
Total Protein: 7.2 g/dL (ref 6.5–8.1)

## 2018-07-27 LAB — CBC
HEMATOCRIT: 36.3 % (ref 36.0–46.0)
HEMOGLOBIN: 12.4 g/dL (ref 12.0–15.0)
MCH: 32.4 pg (ref 26.0–34.0)
MCHC: 34.2 g/dL (ref 30.0–36.0)
MCV: 94.8 fL (ref 80.0–100.0)
NRBC: 0 % (ref 0.0–0.2)
Platelets: 171 10*3/uL (ref 150–400)
RBC: 3.83 MIL/uL — ABNORMAL LOW (ref 3.87–5.11)
RDW: 12.1 % (ref 11.5–15.5)
WBC: 6.5 10*3/uL (ref 4.0–10.5)

## 2018-07-27 LAB — WET PREP, GENITAL
Sperm: NONE SEEN
Trich, Wet Prep: NONE SEEN

## 2018-07-27 LAB — LIPASE, BLOOD: LIPASE: 34 U/L (ref 11–51)

## 2018-07-27 LAB — HCG, QUANTITATIVE, PREGNANCY: HCG, BETA CHAIN, QUANT, S: 92360 m[IU]/mL — AB (ref <5)

## 2018-07-27 MED ORDER — CLOTRIMAZOLE 1 % EX CREA: 1.0000 "application " | TOPICAL_CREAM | Freq: Every day | CUTANEOUS | 0 refills | Status: AC

## 2018-07-27 MED ORDER — PRENATAL VITAMINS 0.8 MG PO TABS: 1.0000 | ORAL_TABLET | Freq: Every day | ORAL | 0 refills | Status: DC

## 2018-07-27 NOTE — ED Triage Notes (Signed)
Pt presents to ed via pov with c/o cramping abdominal pain that has been going on for about two days along with white vaginal discharge. Pt denies any foul odors with discharge. Pt was recently told she was pregnant, but is unsure how far along she is.

## 2018-07-27 NOTE — ED Notes (Addendum)
Pt c/o abdominal cramping x 2 days that radiates to the back, pt states she hasn't had a period since around 10/15.  Pt had 1 prior pregnancy.  Pt states she has regular periods. Pt has hx of trich and was treated for it. Pt reports she has some clear vaginal discharge at times and at times it is white in color. Pt has not started prenatal vitamins at this time.

## 2018-07-27 NOTE — ED Notes (Signed)
ED Provider at bedside. 
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
## 2018-07-30 ENCOUNTER — Emergency Department
Admission: EM | Admit: 2018-07-30 | Discharge: 2018-07-30 | Disposition: A | Discharge status: Home / Self Care | Payer: Medicaid Other | Attending: Emergency Medicine | Admitting: Emergency Medicine

## 2018-07-30 ENCOUNTER — Other Ambulatory Visit: Payer: Self-pay

## 2018-07-30 ENCOUNTER — Encounter: Payer: Self-pay | Admitting: Emergency Medicine

## 2018-07-30 DIAGNOSIS — F17210 Nicotine dependence, cigarettes, uncomplicated: Secondary | ICD-10-CM | POA: Insufficient documentation

## 2018-07-30 DIAGNOSIS — R55 Syncope and collapse: Secondary | ICD-10-CM

## 2018-07-30 LAB — COMPREHENSIVE METABOLIC PANEL
ALBUMIN: 4.4 g/dL (ref 3.5–5.0)
ALK PHOS: 48 U/L (ref 38–126)
ALT: 17 U/L (ref 0–44)
ANION GAP: 8 (ref 5–15)
AST: 22 U/L (ref 15–41)
BILIRUBIN TOTAL: 0.9 mg/dL (ref 0.3–1.2)
BUN: 9 mg/dL (ref 6–20)
CALCIUM: 9.3 mg/dL (ref 8.9–10.3)
CO2: 20 mmol/L — ABNORMAL LOW (ref 22–32)
Chloride: 106 mmol/L (ref 98–111)
Creatinine, Ser: 0.39 mg/dL — ABNORMAL LOW (ref 0.44–1.00)
GFR calc Af Amer: >60 mL/min (ref >60)
GLUCOSE: 86 mg/dL (ref 70–99)
Potassium: 3.5 mmol/L (ref 3.5–5.1)
Sodium: 134 mmol/L — ABNORMAL LOW (ref 135–145)
TOTAL PROTEIN: 7.8 g/dL (ref 6.5–8.1)

## 2018-07-30 LAB — CBC
HEMATOCRIT: 38.3 % (ref 36.0–46.0)
HEMOGLOBIN: 13.3 g/dL (ref 12.0–15.0)
MCH: 32.4 pg (ref 26.0–34.0)
MCHC: 34.7 g/dL (ref 30.0–36.0)
MCV: 93.2 fL (ref 80.0–100.0)
Platelets: 171 10*3/uL (ref 150–400)
RBC: 4.11 MIL/uL (ref 3.87–5.11)
RDW: 11.9 % (ref 11.5–15.5)
WBC: 5.2 10*3/uL (ref 4.0–10.5)
nRBC: 0 % (ref 0.0–0.2)

## 2018-07-30 MED ORDER — ONDANSETRON HCL 4 MG/2ML IJ SOLN
4.0000 mg | Freq: Once | INTRAMUSCULAR | Status: AC
  Administered 2018-07-30: 4 mg via INTRAVENOUS
  Filled 2018-07-30: qty 2

## 2018-07-30 MED ORDER — SODIUM CHLORIDE 0.9 % IV SOLN
1000.0000 mL | Freq: Once | INTRAVENOUS | Status: AC
  Administered 2018-07-30: 1000 mL via INTRAVENOUS

## 2018-07-30 NOTE — ED Triage Notes (Addendum)
PT to ED via EMS from work with near syncopal episode. Pt 10wks preg. PT states she has been having diarrhea and vomiting blood this am. + discharge, denies and vaginal bleeding. VSS. PT tearful upon arrival
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
## 2018-10-23 ENCOUNTER — Emergency Department
Admission: EM | Admit: 2018-10-23 | Discharge: 2018-10-23 | Disposition: A | Discharge status: Home / Self Care | Payer: Medicaid Other | Attending: Emergency Medicine | Admitting: Emergency Medicine

## 2018-10-23 ENCOUNTER — Emergency Department: Payer: Medicaid Other

## 2018-10-23 ENCOUNTER — Encounter: Payer: Self-pay | Admitting: Emergency Medicine

## 2018-10-23 ENCOUNTER — Other Ambulatory Visit: Payer: Self-pay

## 2018-10-23 DIAGNOSIS — M25561 Pain in right knee: Secondary | ICD-10-CM | POA: Diagnosis present

## 2018-10-23 DIAGNOSIS — F17210 Nicotine dependence, cigarettes, uncomplicated: Secondary | ICD-10-CM | POA: Diagnosis not present

## 2018-10-23 DIAGNOSIS — M2391 Unspecified internal derangement of right knee: Secondary | ICD-10-CM | POA: Diagnosis not present

## 2018-10-23 DIAGNOSIS — Z79899 Other long term (current) drug therapy: Secondary | ICD-10-CM | POA: Insufficient documentation

## 2018-10-23 MED ORDER — HYDROCODONE-ACETAMINOPHEN 5-325 MG PO TABS
1.0000 | ORAL_TABLET | Freq: Once | ORAL | Status: AC
  Administered 2018-10-23: 1 via ORAL
  Filled 2018-10-23: qty 1

## 2018-10-23 NOTE — ED Triage Notes (Signed)
Pt presents to ED c/o R knee after slipping down stairs at home. Pt states previous injury to R knee that causes it to give out occasionally. Pt ambulatory to triage with limp. States she used ice pack and ibuprofen earlier that helped with swelling.

## 2018-10-23 NOTE — Discharge Instructions (Addendum)
Follow-up with Dr. Allena Katz.  Please call for an appointment.  Wear the knee immobilizer for the next 2 weeks.  May take it off to shower.  Use crutches and bear weight as tolerated.  Apply ice to the knee for at least 15 minutes 3 times daily.  Take Tylenol and ibuprofen for pain as needed.
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
## 2019-02-24 ENCOUNTER — Other Ambulatory Visit: Payer: Self-pay

## 2019-02-24 ENCOUNTER — Emergency Department
Admission: EM | Admit: 2019-02-24 | Discharge: 2019-02-24 | Disposition: A | Discharge status: Home / Self Care | Payer: Medicaid Other | Attending: Emergency Medicine | Admitting: Emergency Medicine

## 2019-02-24 ENCOUNTER — Encounter: Payer: Self-pay | Admitting: Emergency Medicine

## 2019-02-24 DIAGNOSIS — F17210 Nicotine dependence, cigarettes, uncomplicated: Secondary | ICD-10-CM | POA: Insufficient documentation

## 2019-02-24 DIAGNOSIS — N898 Other specified noninflammatory disorders of vagina: Secondary | ICD-10-CM | POA: Diagnosis present

## 2019-02-24 DIAGNOSIS — N760 Acute vaginitis: Secondary | ICD-10-CM | POA: Insufficient documentation

## 2019-02-24 DIAGNOSIS — B9689 Other specified bacterial agents as the cause of diseases classified elsewhere: Secondary | ICD-10-CM

## 2019-02-24 LAB — URINALYSIS, ROUTINE W REFLEX MICROSCOPIC
Bilirubin Urine: NEGATIVE
Glucose, UA: NEGATIVE mg/dL
Hgb urine dipstick: NEGATIVE
Ketones, ur: NEGATIVE mg/dL
Leukocytes,Ua: NEGATIVE
Nitrite: NEGATIVE
Protein, ur: NEGATIVE mg/dL
Specific Gravity, Urine: 1.005 (ref 1.005–1.030)
pH: 7 (ref 5.0–8.0)

## 2019-02-24 LAB — WET PREP, GENITAL
Sperm: NONE SEEN
Trich, Wet Prep: NONE SEEN
Yeast Wet Prep HPF POC: NONE SEEN

## 2019-02-24 LAB — POCT PREGNANCY, URINE: Preg Test, Ur: NEGATIVE

## 2019-02-24 LAB — CHLAMYDIA/NGC RT PCR (ARMC ONLY)
Chlamydia Tr: NOT DETECTED
N gonorrhoeae: NOT DETECTED

## 2019-02-24 MED ORDER — METRONIDAZOLE 0.75 % VA GEL: 1.0000 | Freq: Two times a day (BID) | VAGINAL | 0 refills | Status: AC

## 2019-02-24 NOTE — ED Notes (Signed)
See triage note  Presents with some pressure with urination   Vaginal discharge  And itching  States she has been treated recently for BV but did not finish meds
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
## 2019-02-24 NOTE — ED Triage Notes (Signed)
Arrives with c/o persistent vaginal discharge and vaginal itching.  Seen by PCP, has been treated for yeast and BV.  Has had a urinalysis, but not sure of results.  Patient now c/o pelvic pain and low back pain.  AAOx3.  Skin warm and dry. NAD

## 2019-02-27 ENCOUNTER — Telehealth: Payer: Self-pay | Admitting: Emergency Medicine

## 2019-02-27 NOTE — Telephone Encounter (Signed)
Called patient back after seh left me a message.  I gave her negative results for gc/chlamydia

## 2019-03-18 ENCOUNTER — Encounter: Payer: Self-pay | Admitting: Medical Oncology

## 2019-03-18 ENCOUNTER — Emergency Department
Admission: EM | Admit: 2019-03-18 | Discharge: 2019-03-18 | Disposition: A | Discharge status: Home / Self Care | Payer: Medicaid Other | Attending: Student | Admitting: Student

## 2019-03-18 ENCOUNTER — Telehealth: Payer: Self-pay | Admitting: Emergency Medicine

## 2019-03-18 ENCOUNTER — Other Ambulatory Visit: Payer: Self-pay

## 2019-03-18 DIAGNOSIS — Z79899 Other long term (current) drug therapy: Secondary | ICD-10-CM | POA: Insufficient documentation

## 2019-03-18 DIAGNOSIS — F17210 Nicotine dependence, cigarettes, uncomplicated: Secondary | ICD-10-CM | POA: Diagnosis not present

## 2019-03-18 DIAGNOSIS — N760 Acute vaginitis: Secondary | ICD-10-CM | POA: Diagnosis not present

## 2019-03-18 DIAGNOSIS — B9689 Other specified bacterial agents as the cause of diseases classified elsewhere: Secondary | ICD-10-CM | POA: Insufficient documentation

## 2019-03-18 DIAGNOSIS — Z202 Contact with and (suspected) exposure to infections with a predominantly sexual mode of transmission: Secondary | ICD-10-CM | POA: Diagnosis present

## 2019-03-18 DIAGNOSIS — O23599 Infection of other part of genital tract in pregnancy, unspecified trimester: Secondary | ICD-10-CM

## 2019-03-18 LAB — URINALYSIS, COMPLETE (UACMP) WITH MICROSCOPIC
Bilirubin Urine: NEGATIVE
Glucose, UA: NEGATIVE mg/dL
Hgb urine dipstick: NEGATIVE
Ketones, ur: NEGATIVE mg/dL
Nitrite: NEGATIVE
Protein, ur: NEGATIVE mg/dL
Specific Gravity, Urine: 1.025 (ref 1.005–1.030)
pH: 6 (ref 5.0–8.0)

## 2019-03-18 LAB — WET PREP, GENITAL
Clue Cells Wet Prep HPF POC: NONE SEEN
Sperm: NONE SEEN
Trich, Wet Prep: NONE SEEN
Yeast Wet Prep HPF POC: NONE SEEN

## 2019-03-18 LAB — CHLAMYDIA/NGC RT PCR (ARMC ONLY)
Chlamydia Tr: NOT DETECTED
N gonorrhoeae: NOT DETECTED

## 2019-03-18 LAB — POCT PREGNANCY, URINE: Preg Test, Ur: NEGATIVE
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
## 2019-03-18 NOTE — Discharge Instructions (Addendum)
Your exam and labs are normal at this time. You have a STD test still pending. You may call back in 2 hours for the results. Follow-up with [facility] for routine care and birth control. Return to the ED a needed.

## 2019-03-18 NOTE — Telephone Encounter (Signed)
Patient called asking for std results.  I gave her result.

## 2019-03-18 NOTE — ED Notes (Signed)
See triage note   Presents with some vaginal itching and some discharge    States she was seen on 07/13 for BV

## 2019-03-18 NOTE — ED Notes (Signed)
Pt states her partner told her to come in for "testing". States she has also had bacterial vaginosis and a yeast infection recently.

## 2019-03-18 NOTE — ED Triage Notes (Signed)
Pt reports she is here for STD testing

## 2019-04-01 ENCOUNTER — Emergency Department
Admission: EM | Admit: 2019-04-01 | Discharge: 2019-04-01 | Disposition: A | Discharge status: Home / Self Care | Payer: Medicaid Other | Attending: Student in an Organized Health Care Education/Training Program | Admitting: Student in an Organized Health Care Education/Training Program

## 2019-04-01 ENCOUNTER — Emergency Department: Payer: Medicaid Other

## 2019-04-01 ENCOUNTER — Other Ambulatory Visit: Payer: Self-pay

## 2019-04-01 DIAGNOSIS — F17210 Nicotine dependence, cigarettes, uncomplicated: Secondary | ICD-10-CM | POA: Insufficient documentation

## 2019-04-01 DIAGNOSIS — R1013 Epigastric pain: Secondary | ICD-10-CM | POA: Diagnosis present

## 2019-04-01 LAB — COMPREHENSIVE METABOLIC PANEL
ALT: 17 U/L (ref 0–44)
AST: 21 U/L (ref 15–41)
Albumin: 4.4 g/dL (ref 3.5–5.0)
Alkaline Phosphatase: 62 U/L (ref 38–126)
Anion gap: 10 (ref 5–15)
BUN: 9 mg/dL (ref 6–20)
CO2: 20 mmol/L — ABNORMAL LOW (ref 22–32)
Calcium: 10 mg/dL (ref 8.9–10.3)
Chloride: 105 mmol/L (ref 98–111)
Creatinine, Ser: 0.5 mg/dL (ref 0.44–1.00)
GFR calc Af Amer: >60 mL/min (ref >60)
GFR calc non Af Amer: >60 mL/min (ref >60)
Glucose, Bld: 86 mg/dL (ref 70–99)
Potassium: 3.5 mmol/L (ref 3.5–5.1)
Sodium: 135 mmol/L (ref 135–145)
Total Bilirubin: 1.1 mg/dL (ref 0.3–1.2)
Total Protein: 8 g/dL (ref 6.5–8.1)

## 2019-04-01 LAB — URINALYSIS, COMPLETE (UACMP) WITH MICROSCOPIC
Bacteria, UA: NONE SEEN
Bilirubin Urine: NEGATIVE
Glucose, UA: NEGATIVE mg/dL
Hgb urine dipstick: NEGATIVE
Ketones, ur: NEGATIVE mg/dL
Leukocytes,Ua: NEGATIVE
Nitrite: NEGATIVE
Protein, ur: NEGATIVE mg/dL
Specific Gravity, Urine: 1.001 — ABNORMAL LOW (ref 1.005–1.030)
WBC, UA: NONE SEEN WBC/hpf (ref 0–5)
pH: 6 (ref 5.0–8.0)

## 2019-04-01 LAB — CBC
HCT: 41.2 % (ref 36.0–46.0)
Hemoglobin: 14 g/dL (ref 12.0–15.0)
MCH: 32.8 pg (ref 26.0–34.0)
MCHC: 34 g/dL (ref 30.0–36.0)
MCV: 96.5 fL (ref 80.0–100.0)
Platelets: 200 10*3/uL (ref 150–400)
RBC: 4.27 MIL/uL (ref 3.87–5.11)
RDW: 11.9 % (ref 11.5–15.5)
WBC: 5.7 10*3/uL (ref 4.0–10.5)
nRBC: 0 % (ref 0.0–0.2)

## 2019-04-01 LAB — POCT PREGNANCY, URINE: Preg Test, Ur: NEGATIVE

## 2019-04-01 LAB — LIPASE, BLOOD: Lipase: 27 U/L (ref 11–51)

## 2019-04-01 MED ORDER — DICYCLOMINE HCL 10 MG PO CAPS
10.0000 mg | ORAL_CAPSULE | Freq: Once | ORAL | Status: AC
  Administered 2019-04-01: 10 mg via ORAL
  Filled 2019-04-01: qty 1

## 2019-04-01 MED ORDER — ONDANSETRON 4 MG PO TBDP: 4.0000 mg | ORAL_TABLET | Freq: Three times a day (TID) | ORAL | 0 refills | Status: DC | PRN

## 2019-04-01 MED ORDER — DICYCLOMINE HCL 10 MG PO CAPS: 10.0000 mg | ORAL_CAPSULE | Freq: Three times a day (TID) | ORAL | 0 refills | Status: DC | PRN

## 2019-04-01 MED ORDER — POLYETHYLENE GLYCOL 3350 17 G PO PACK: 17.0000 g | PACK | Freq: Every day | ORAL | 0 refills | Status: DC

## 2019-04-01 MED ORDER — ALUM & MAG HYDROXIDE-SIMETH 200-200-20 MG/5ML PO SUSP
30.0000 mL | Freq: Once | ORAL | Status: AC
  Administered 2019-04-01: 30 mL via ORAL
  Filled 2019-04-01: qty 30

## 2019-04-01 MED ORDER — LIDOCAINE VISCOUS HCL 2 % MT SOLN
15.0000 mL | Freq: Once | OROMUCOSAL | Status: AC
  Administered 2019-04-01: 15 mL via ORAL
  Filled 2019-04-01: qty 15

## 2019-04-01 MED ORDER — SODIUM CHLORIDE 0.9% FLUSH: 3.0000 mL | Freq: Once | INTRAVENOUS | Status: DC

## 2019-04-01 MED ORDER — SODIUM CHLORIDE 0.9 % IV BOLUS
500.0000 mL | Freq: Once | INTRAVENOUS | Status: AC
  Administered 2019-04-01: 500 mL via INTRAVENOUS

## 2019-04-01 NOTE — ED Triage Notes (Signed)
Pt comes into the ED via EMS from home with feeling weak , states she has chronic abd pain with constipation and today took 3 IBU and then took 4 vitamins thinking that would help and because sick on her stomach. Pt states last BM 3 days ago.

## 2019-04-01 NOTE — Discharge Instructions (Addendum)
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
## 2019-04-01 NOTE — ED Notes (Signed)
Pt alert and oriented X 4, stable for discharge. RR even and unlabored, color WNL. Discussed discharge instructions and follow up when appropriate. Instructed to follow up with ER for any life threatening symptoms or concerns that patient or family of patient may have  

## 2019-04-28 ENCOUNTER — Encounter: Payer: Self-pay | Admitting: Emergency Medicine

## 2019-04-28 ENCOUNTER — Emergency Department: Payer: Medicaid Other

## 2019-04-28 ENCOUNTER — Other Ambulatory Visit: Payer: Self-pay

## 2019-04-28 ENCOUNTER — Emergency Department
Admission: EM | Admit: 2019-04-28 | Discharge: 2019-04-28 | Disposition: A | Discharge status: Home / Self Care | Payer: Medicaid Other | Attending: Emergency Medicine | Admitting: Emergency Medicine

## 2019-04-28 DIAGNOSIS — Y999 Unspecified external cause status: Secondary | ICD-10-CM | POA: Diagnosis not present

## 2019-04-28 DIAGNOSIS — Y929 Unspecified place or not applicable: Secondary | ICD-10-CM | POA: Insufficient documentation

## 2019-04-28 DIAGNOSIS — F17210 Nicotine dependence, cigarettes, uncomplicated: Secondary | ICD-10-CM | POA: Diagnosis not present

## 2019-04-28 DIAGNOSIS — Y9389 Activity, other specified: Secondary | ICD-10-CM | POA: Diagnosis not present

## 2019-04-28 DIAGNOSIS — Z79899 Other long term (current) drug therapy: Secondary | ICD-10-CM | POA: Diagnosis not present

## 2019-04-28 DIAGNOSIS — M25562 Pain in left knee: Secondary | ICD-10-CM | POA: Insufficient documentation

## 2019-04-28 DIAGNOSIS — S20212A Contusion of left front wall of thorax, initial encounter: Secondary | ICD-10-CM | POA: Insufficient documentation

## 2019-04-28 DIAGNOSIS — S299XXA Unspecified injury of thorax, initial encounter: Secondary | ICD-10-CM | POA: Diagnosis present

## 2019-04-28 MED ORDER — LIDOCAINE 5 % EX PTCH
1.0000 | MEDICATED_PATCH | CUTANEOUS | Status: DC
  Administered 2019-04-28: 10:00:00 1 via TRANSDERMAL
  Filled 2019-04-28: qty 1

## 2019-04-28 MED ORDER — IBUPROFEN 600 MG PO TABS
600.0000 mg | ORAL_TABLET | Freq: Once | ORAL | Status: AC
  Administered 2019-04-28: 10:00:00 600 mg via ORAL
  Filled 2019-04-28: qty 1

## 2019-04-28 MED ORDER — IBUPROFEN 600 MG PO TABS: 600.0000 mg | ORAL_TABLET | Freq: Three times a day (TID) | ORAL | 0 refills | Status: DC | PRN

## 2019-04-28 MED ORDER — TRAMADOL HCL 50 MG PO TABS
50.0000 mg | ORAL_TABLET | Freq: Once | ORAL | Status: AC
  Administered 2019-04-28: 10:00:00 50 mg via ORAL
  Filled 2019-04-28: qty 1

## 2019-04-28 MED ORDER — CYCLOBENZAPRINE HCL 10 MG PO TABS
10.0000 mg | ORAL_TABLET | Freq: Once | ORAL | Status: AC
  Administered 2019-04-28: 10 mg via ORAL
  Filled 2019-04-28: qty 1

## 2019-04-28 MED ORDER — TRAMADOL HCL 50 MG PO TABS: 50.0000 mg | ORAL_TABLET | Freq: Four times a day (QID) | ORAL | 0 refills | Status: DC | PRN

## 2019-04-28 MED ORDER — CYCLOBENZAPRINE HCL 10 MG PO TABS: 10.0000 mg | ORAL_TABLET | Freq: Three times a day (TID) | ORAL | 0 refills | Status: DC | PRN
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
## 2019-05-05 ENCOUNTER — Other Ambulatory Visit: Payer: Self-pay

## 2019-05-05 ENCOUNTER — Emergency Department
Admission: EM | Admit: 2019-05-05 | Discharge: 2019-05-05 | Disposition: A | Discharge status: Home / Self Care | Payer: Medicaid Other | Attending: Emergency Medicine | Admitting: Emergency Medicine

## 2019-05-05 ENCOUNTER — Encounter: Payer: Self-pay | Admitting: Emergency Medicine

## 2019-05-05 DIAGNOSIS — N760 Acute vaginitis: Secondary | ICD-10-CM | POA: Diagnosis not present

## 2019-05-05 DIAGNOSIS — Z79899 Other long term (current) drug therapy: Secondary | ICD-10-CM | POA: Diagnosis not present

## 2019-05-05 DIAGNOSIS — Z113 Encounter for screening for infections with a predominantly sexual mode of transmission: Secondary | ICD-10-CM

## 2019-05-05 DIAGNOSIS — Z202 Contact with and (suspected) exposure to infections with a predominantly sexual mode of transmission: Secondary | ICD-10-CM | POA: Insufficient documentation

## 2019-05-05 DIAGNOSIS — F17210 Nicotine dependence, cigarettes, uncomplicated: Secondary | ICD-10-CM | POA: Diagnosis not present

## 2019-05-05 DIAGNOSIS — N3000 Acute cystitis without hematuria: Secondary | ICD-10-CM | POA: Diagnosis not present

## 2019-05-05 DIAGNOSIS — N898 Other specified noninflammatory disorders of vagina: Secondary | ICD-10-CM | POA: Diagnosis present

## 2019-05-05 DIAGNOSIS — B9689 Other specified bacterial agents as the cause of diseases classified elsewhere: Secondary | ICD-10-CM

## 2019-05-05 LAB — URINALYSIS, COMPLETE (UACMP) WITH MICROSCOPIC
Bilirubin Urine: NEGATIVE
Glucose, UA: NEGATIVE mg/dL
Hgb urine dipstick: NEGATIVE
Ketones, ur: 20 mg/dL — AB
Nitrite: NEGATIVE
Protein, ur: NEGATIVE mg/dL
Specific Gravity, Urine: 1.024 (ref 1.005–1.030)
WBC, UA: >50 WBC/hpf — ABNORMAL HIGH (ref 0–5)
pH: 5 (ref 5.0–8.0)

## 2019-05-05 LAB — WET PREP, GENITAL
Sperm: NONE SEEN
Trich, Wet Prep: NONE SEEN
Yeast Wet Prep HPF POC: NONE SEEN

## 2019-05-05 LAB — PREGNANCY, URINE: Preg Test, Ur: NEGATIVE

## 2019-05-05 MED ORDER — METRONIDAZOLE 500 MG PO TABS: 500.0000 mg | ORAL_TABLET | Freq: Two times a day (BID) | ORAL | 0 refills | Status: AC

## 2019-05-05 MED ORDER — CEPHALEXIN 500 MG PO CAPS: 500.0000 mg | ORAL_CAPSULE | Freq: Two times a day (BID) | ORAL | 0 refills | Status: AC

## 2019-05-05 NOTE — ED Triage Notes (Signed)
Pt presents to ED, states was told she needed to get checked for possible STD. Pt states has had vaginal discharge and itching.
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
## 2019-05-05 NOTE — ED Notes (Signed)
See triage note  States she was told by her sex partner that he was checked for STD and wanted her to be checked   Having some slight vaginal discharge

## 2019-05-08 LAB — GC/CHLAMYDIA PROBE AMP
Chlamydia trachomatis, NAA: NEGATIVE
Neisseria Gonorrhoeae by PCR: NEGATIVE

## 2019-06-05 ENCOUNTER — Other Ambulatory Visit: Payer: Self-pay

## 2019-06-05 ENCOUNTER — Encounter: Payer: Self-pay | Admitting: Emergency Medicine

## 2019-06-05 ENCOUNTER — Emergency Department: Payer: Medicaid Other

## 2019-06-05 ENCOUNTER — Observation Stay
Admission: EM | Admit: 2019-06-05 | Discharge: 2019-06-05 | Disposition: A | Discharge status: Home / Self Care | Payer: Medicaid Other | Attending: Internal Medicine | Admitting: Internal Medicine

## 2019-06-05 DIAGNOSIS — K760 Fatty (change of) liver, not elsewhere classified: Secondary | ICD-10-CM | POA: Diagnosis not present

## 2019-06-05 DIAGNOSIS — R112 Nausea with vomiting, unspecified: Secondary | ICD-10-CM | POA: Diagnosis present

## 2019-06-05 DIAGNOSIS — R300 Dysuria: Secondary | ICD-10-CM | POA: Insufficient documentation

## 2019-06-05 DIAGNOSIS — R110 Nausea: Secondary | ICD-10-CM

## 2019-06-05 DIAGNOSIS — Z20828 Contact with and (suspected) exposure to other viral communicable diseases: Secondary | ICD-10-CM | POA: Diagnosis not present

## 2019-06-05 DIAGNOSIS — F4310 Post-traumatic stress disorder, unspecified: Secondary | ICD-10-CM | POA: Diagnosis not present

## 2019-06-05 DIAGNOSIS — R102 Pelvic and perineal pain: Secondary | ICD-10-CM

## 2019-06-05 DIAGNOSIS — F329 Major depressive disorder, single episode, unspecified: Secondary | ICD-10-CM | POA: Diagnosis not present

## 2019-06-05 DIAGNOSIS — R109 Unspecified abdominal pain: Secondary | ICD-10-CM | POA: Insufficient documentation

## 2019-06-05 DIAGNOSIS — R070 Pain in throat: Secondary | ICD-10-CM | POA: Diagnosis not present

## 2019-06-05 DIAGNOSIS — F17210 Nicotine dependence, cigarettes, uncomplicated: Secondary | ICD-10-CM | POA: Insufficient documentation

## 2019-06-05 LAB — LIPASE, BLOOD: Lipase: 25 U/L (ref 11–51)

## 2019-06-05 LAB — CBC WITH DIFFERENTIAL/PLATELET
Abs Immature Granulocytes: 0.01 10*3/uL (ref 0.00–0.07)
Basophils Absolute: 0 10*3/uL (ref 0.0–0.1)
Basophils Relative: 1 %
Eosinophils Absolute: 0.1 10*3/uL (ref 0.0–0.5)
Eosinophils Relative: 1 %
HCT: 40.1 % (ref 36.0–46.0)
Hemoglobin: 13.7 g/dL (ref 12.0–15.0)
Immature Granulocytes: 0 %
Lymphocytes Relative: 44 %
Lymphs Abs: 1.9 10*3/uL (ref 0.7–4.0)
MCH: 32.8 pg (ref 26.0–34.0)
MCHC: 34.2 g/dL (ref 30.0–36.0)
MCV: 95.9 fL (ref 80.0–100.0)
Monocytes Absolute: 0.3 10*3/uL (ref 0.1–1.0)
Monocytes Relative: 7 %
Neutro Abs: 2.1 10*3/uL (ref 1.7–7.7)
Neutrophils Relative %: 47 %
Platelets: 163 10*3/uL (ref 150–400)
RBC: 4.18 MIL/uL (ref 3.87–5.11)
RDW: 12 % (ref 11.5–15.5)
WBC: 4.4 10*3/uL (ref 4.0–10.5)
nRBC: 0 % (ref 0.0–0.2)

## 2019-06-05 LAB — URINALYSIS, COMPLETE (UACMP) WITH MICROSCOPIC
Bacteria, UA: NONE SEEN
Bilirubin Urine: NEGATIVE
Glucose, UA: NEGATIVE mg/dL
Hgb urine dipstick: NEGATIVE
Ketones, ur: 5 mg/dL — AB
Leukocytes,Ua: NEGATIVE
Nitrite: NEGATIVE
Protein, ur: NEGATIVE mg/dL
Specific Gravity, Urine: 1.017 (ref 1.005–1.030)
pH: 6 (ref 5.0–8.0)

## 2019-06-05 LAB — COMPREHENSIVE METABOLIC PANEL
ALT: 14 U/L (ref 0–44)
AST: 19 U/L (ref 15–41)
Albumin: 4.5 g/dL (ref 3.5–5.0)
Alkaline Phosphatase: 64 U/L (ref 38–126)
Anion gap: 12 (ref 5–15)
BUN: 15 mg/dL (ref 6–20)
CO2: 21 mmol/L — ABNORMAL LOW (ref 22–32)
Calcium: 9.1 mg/dL (ref 8.9–10.3)
Chloride: 107 mmol/L (ref 98–111)
Creatinine, Ser: 0.51 mg/dL (ref 0.44–1.00)
GFR calc Af Amer: >60 mL/min (ref >60)
GFR calc non Af Amer: >60 mL/min (ref >60)
Glucose, Bld: 73 mg/dL (ref 70–99)
Potassium: 3.9 mmol/L (ref 3.5–5.1)
Sodium: 140 mmol/L (ref 135–145)
Total Bilirubin: 0.7 mg/dL (ref 0.3–1.2)
Total Protein: 7.9 g/dL (ref 6.5–8.1)

## 2019-06-05 LAB — GLUCOSE, CAPILLARY
Glucose-Capillary: 54 mg/dL — ABNORMAL LOW (ref 70–99)
Glucose-Capillary: 155 mg/dL — ABNORMAL HIGH (ref 70–99)
Glucose-Capillary: 64 mg/dL — ABNORMAL LOW (ref 70–99)
Glucose-Capillary: 266 mg/dL — ABNORMAL HIGH (ref 70–99)

## 2019-06-05 LAB — SARS CORONAVIRUS 2 (TAT 6-24 HRS): SARS Coronavirus 2: NEGATIVE

## 2019-06-05 LAB — POCT PREGNANCY, URINE: Preg Test, Ur: NEGATIVE

## 2019-06-05 IMAGING — CT CT ABD-PELV W/ CM
2 of 4 series · 16 of 46 positions shown, 18 images · IV contrast (APPLIED)
Comparison: [DATE]

CLINICAL DATA: Nausea and vomiting

EXAM:
CT ABDOMEN AND PELVIS WITH CONTRAST
TECHNIQUE: Multidetector CT imaging of the abdomen and pelvis was performed
using the standard protocol following bolus administration of
intravenous contrast.
CONTRAST:  75mL OMNIPAQUE IOHEXOL 300 MG/ML  SOLN

[Series 2: axial st · axial · 0.58mm/px · z∈[-1009,-649]mm · 13 of 80 slices shown, 15 images]
[im 4/80  soft-tissue]
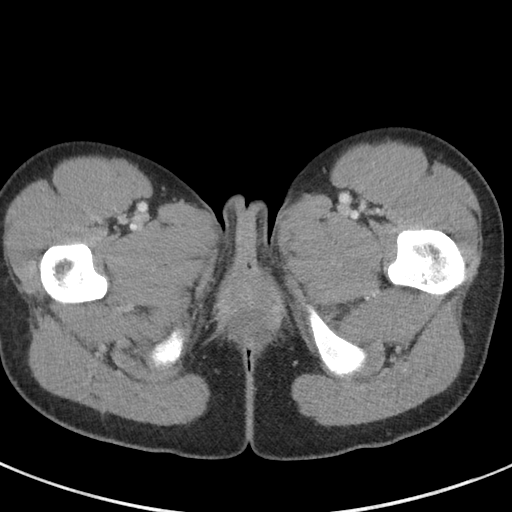
[im 4/80  bone]
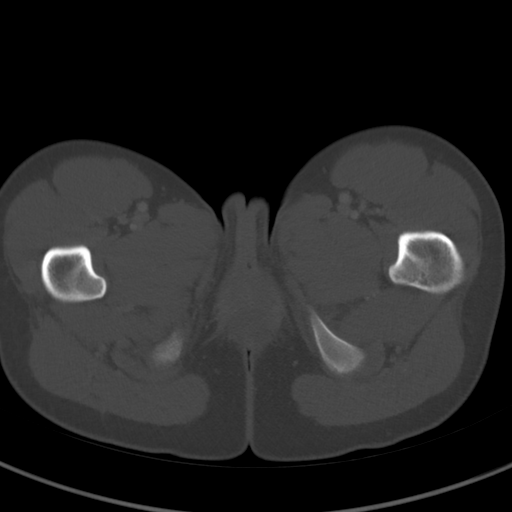
[im 10/80  soft-tissue]
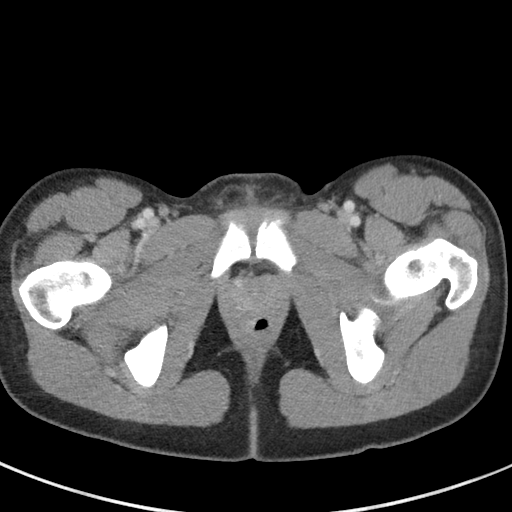
[im 17/80  soft-tissue]
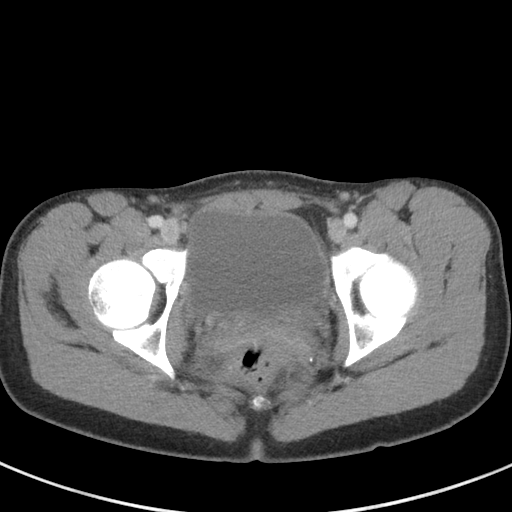
[im 24/80  soft-tissue]
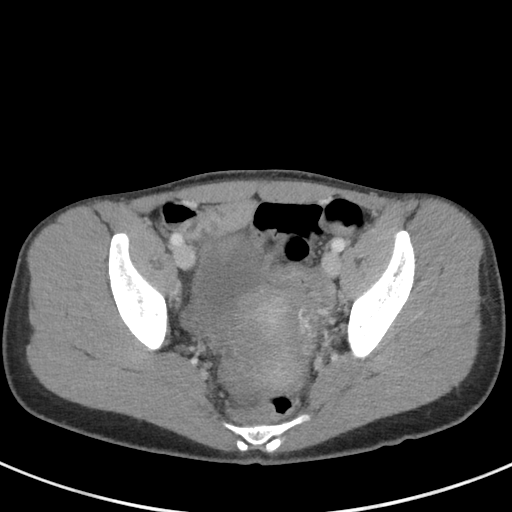
[im 27/80  soft-tissue]
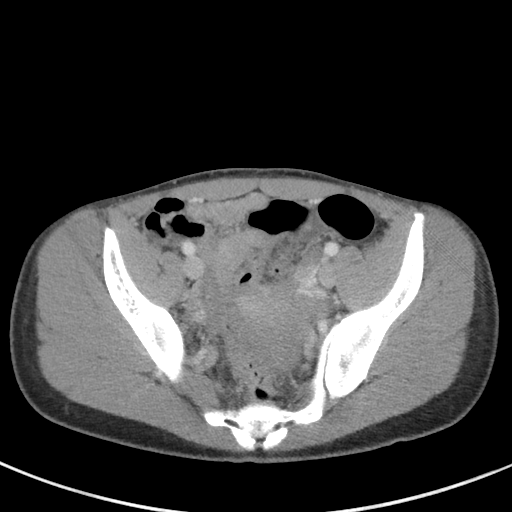
[im 33/80  soft-tissue]
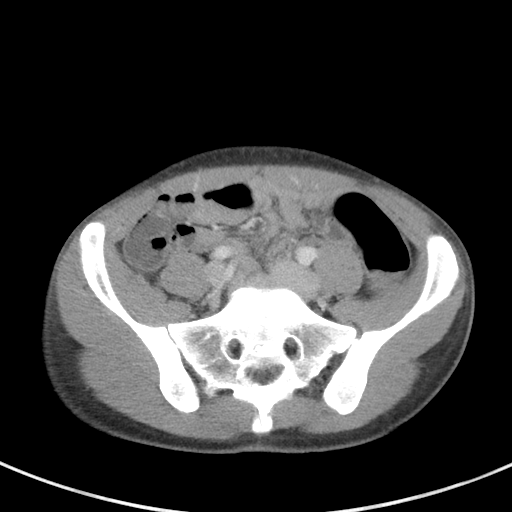
[im 40/80  soft-tissue]
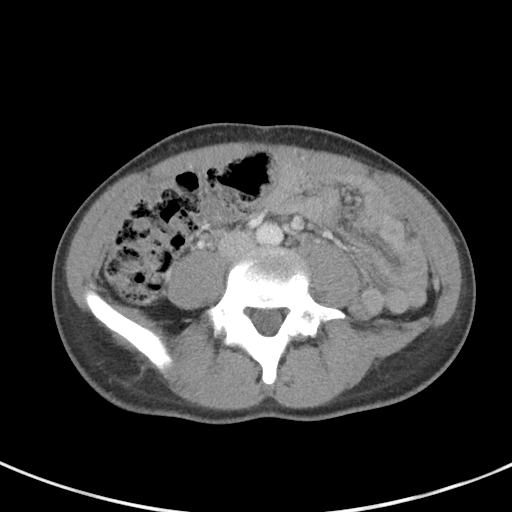
[im 47/80  soft-tissue]
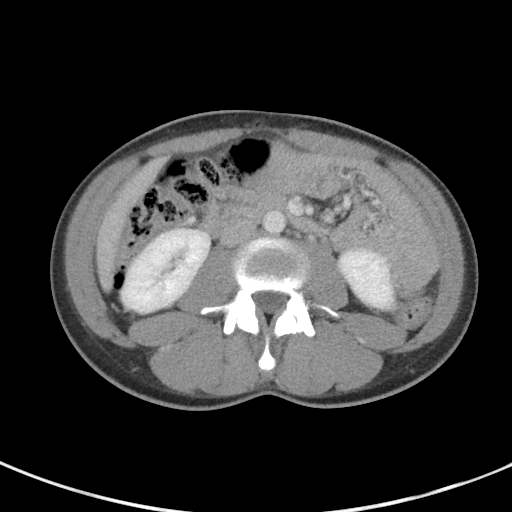
[im 53/80  soft-tissue]
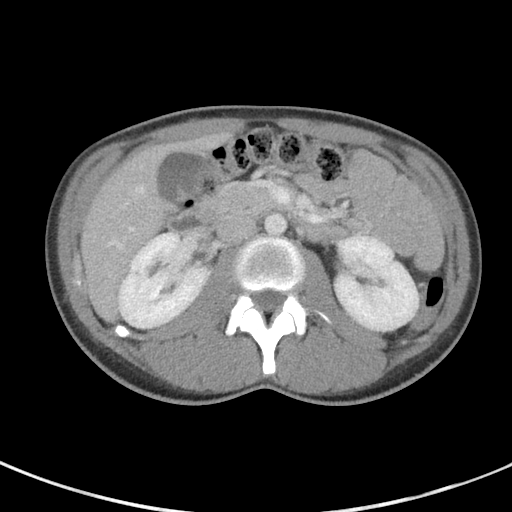
[im 53/80  bone]
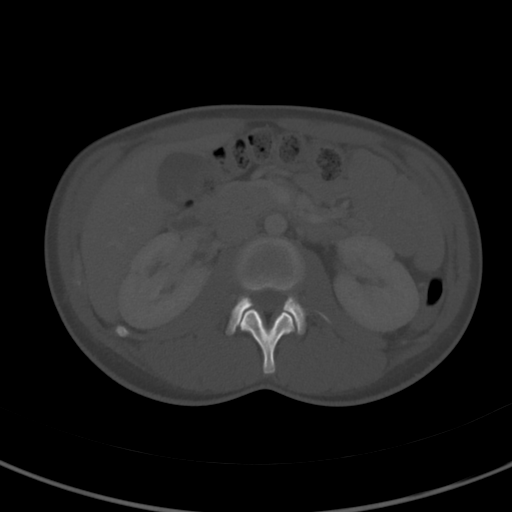
[im 56/80  soft-tissue]
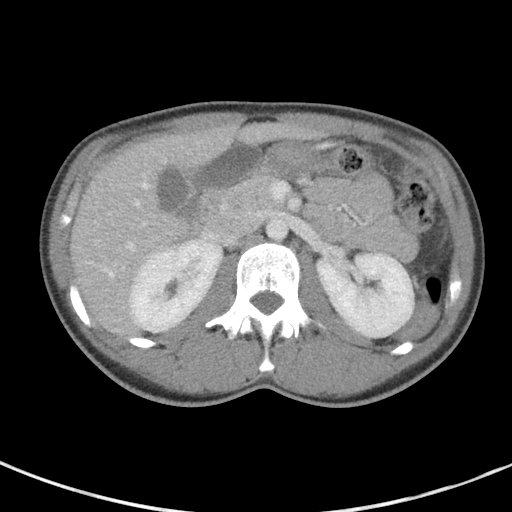
[im 63/80  soft-tissue]
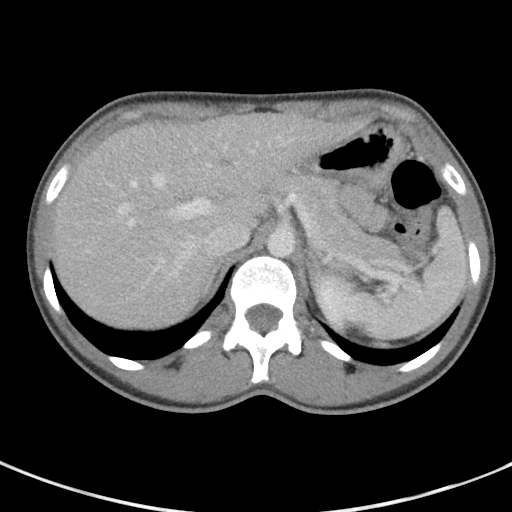
[im 70/80  soft-tissue]
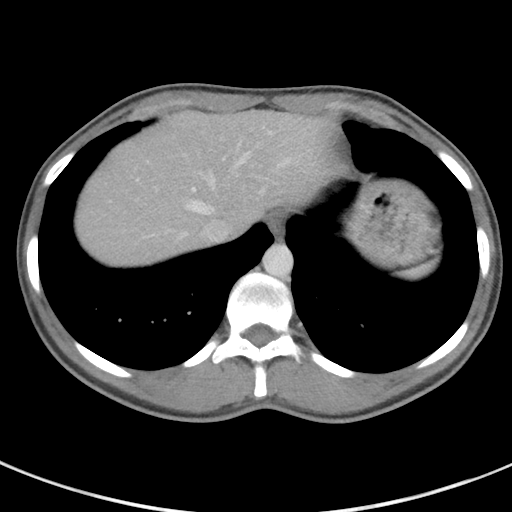
[im 76/80  soft-tissue]
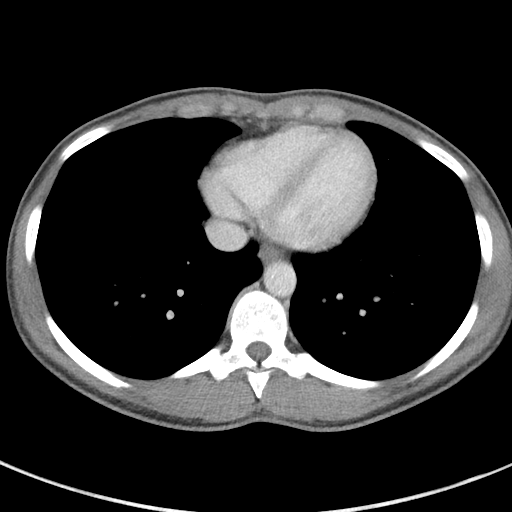

[Series 6: coronal st · coronal · 0.62mm/px · 3 of 76 slices shown]
[im 26/76  soft-tissue]
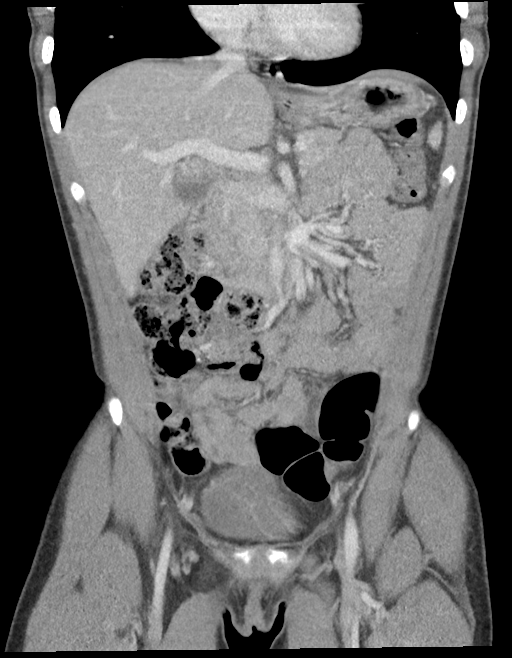
[im 34/76  soft-tissue]
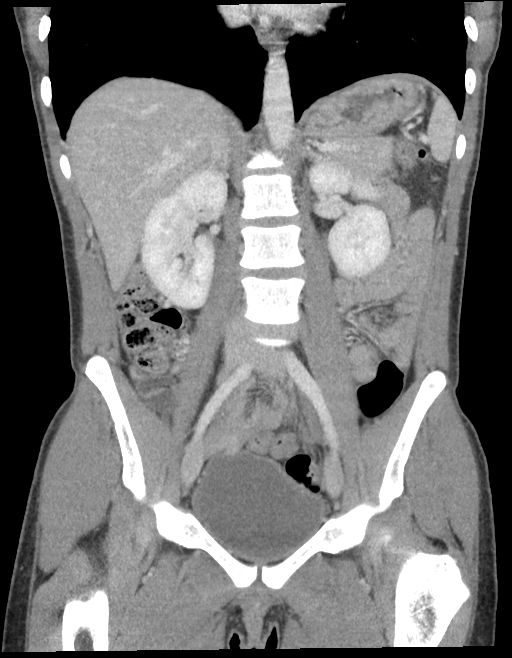
[im 42/76  soft-tissue]
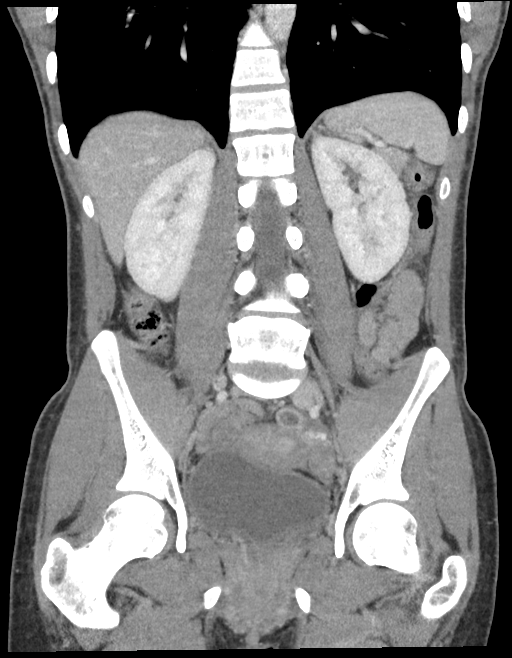

[16 of 46 positions shown; findings below may reference images not displayed]

FINDINGS: Lower chest: No acute abnormality.

Hepatobiliary: Mild fatty infiltration of the liver is noted. The
gallbladder is within normal limits.

Pancreas: Unremarkable. No pancreatic ductal dilatation or
surrounding inflammatory changes.

Spleen: Normal in size without focal abnormality.

Adrenals/Urinary Tract: Adrenal glands are within normal limits.
Kidneys demonstrate a normal enhancement pattern bilaterally. No
obstructive changes are seen. The bladder is partially distended.

Stomach/Bowel: The appendix is within normal limits. No obstructive
or inflammatory changes of the larger small-bowel are seen. The
stomach is within normal limits.

Vascular/Lymphatic: No significant vascular findings are present. No
enlarged abdominal or pelvic lymph nodes.

Reproductive: Uterus is unremarkable. Cystic lesion is noted in the
left adnexa similar to that noted on recent ultrasound.

Other: No abdominal wall hernia or abnormality. No abdominopelvic
ascites.

Musculoskeletal: No acute or significant osseous findings.
IMPRESSION: No acute abnormality noted.

Mild fatty infiltration of the liver.

## 2019-06-05 MED ORDER — ONDANSETRON HCL 4 MG/2ML IJ SOLN
4.0000 mg | Freq: Once | INTRAMUSCULAR | Status: AC
  Administered 2019-06-05: 4 mg via INTRAVENOUS
  Filled 2019-06-05: qty 2

## 2019-06-05 MED ORDER — MORPHINE SULFATE (PF) 4 MG/ML IV SOLN
4.0000 mg | Freq: Once | INTRAVENOUS | Status: AC
  Administered 2019-06-05: 09:00:00 4 mg via INTRAVENOUS
  Filled 2019-06-05: qty 1

## 2019-06-05 MED ORDER — ONDANSETRON 4 MG PO TBDP: 4.0000 mg | ORAL_TABLET | Freq: Three times a day (TID) | ORAL | 0 refills | Status: DC | PRN

## 2019-06-05 MED ORDER — NICOTINE 21 MG/24HR TD PT24
21.0000 mg | MEDICATED_PATCH | Freq: Every day | TRANSDERMAL | Status: DC
  Filled 2019-06-05: qty 1

## 2019-06-05 MED ORDER — DEXTROSE 5 % IN LACTATED RINGERS IV BOLUS
1000.0000 mL | Freq: Once | INTRAVENOUS | Status: AC
  Administered 2019-06-05: 1000 mL via INTRAVENOUS
  Filled 2019-06-05: qty 1000

## 2019-06-05 MED ORDER — PROMETHAZINE HCL 25 MG/ML IJ SOLN
25.0000 mg | Freq: Once | INTRAMUSCULAR | Status: AC
  Administered 2019-06-05: 25 mg via INTRAVENOUS
  Filled 2019-06-05: qty 1

## 2019-06-05 MED ORDER — DEXTROSE 50 % IV SOLN
25.0000 mL | Freq: Once | INTRAVENOUS | Status: AC
  Administered 2019-06-05: 12:00:00 25 mL via INTRAVENOUS
  Filled 2019-06-05: qty 50

## 2019-06-05 MED ORDER — SODIUM CHLORIDE 0.9 % IV SOLN
Freq: Once | INTRAVENOUS | Status: AC
  Administered 2019-06-05: 09:00:00 via INTRAVENOUS

## 2019-06-05 MED ORDER — IOHEXOL 300 MG/ML  SOLN
75.0000 mL | Freq: Once | INTRAMUSCULAR | Status: AC | PRN
  Administered 2019-06-05: 75 mL via INTRAVENOUS

## 2019-06-05 NOTE — ED Notes (Signed)
While discharging pt I observed patient to be weak and pt was c/o feeling dehydated. I notified Val RN and we assessed the pt's blood sugar to be 54. Juice and crackers were offered but pt began vomitting. Dr notified and 1/2 amp D50 was ordered and administered.
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
## 2019-06-05 NOTE — ED Notes (Signed)
Pt aunt called said she's here to pick up patient.  When rechecking blood sugar patient started vomiting again.  MD notified and medication order placed.

## 2019-06-05 NOTE — ED Notes (Signed)
Pt wanting to leave AMA, does not have childcare. Dr pyreddy notified.
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
## 2019-06-05 NOTE — ED Notes (Signed)
Called Lab to send over some more COVID swab kits.

## 2019-06-05 NOTE — ED Notes (Signed)
Contacted Pt's Aunt to bring Pt's belongings.

## 2019-06-05 NOTE — ED Triage Notes (Addendum)
Pt arrived via [facility] from work c/o of lower abdominal pain w/ vomitting while at work.

## 2019-06-05 NOTE — ED Notes (Signed)
Patient transported to CT 

## 2019-06-05 NOTE — ED Notes (Signed)
Pt given juice and crackers. Was able to drink and keep down the drink in room.

## 2019-06-05 NOTE — ED Notes (Signed)
Patient transported to Ultrasound 

## 2019-06-05 NOTE — ED Notes (Signed)
Admitting MD at bedisde

## 2019-06-05 NOTE — ED Notes (Signed)
While

## 2019-06-05 NOTE — ED Provider Notes (Signed)
Hospitalist requested that we would discharge patient and prescribe her Zofran for her.  They did not want her to be leaving AMA but were okay with putting in as a discharge.     Vanessa Englewood, MD 06/05/19 [phone]

## 2019-06-07 LAB — GC/CHLAMYDIA PROBE AMP
Chlamydia trachomatis, NAA: POSITIVE — AB
Neisseria Gonorrhoeae by PCR: NEGATIVE

## 2019-06-18 ENCOUNTER — Telehealth: Payer: Self-pay | Admitting: Emergency Medicine

## 2019-06-18 NOTE — Telephone Encounter (Signed)
Contacted patient and gave her results.  Med called to [facility].

## 2019-06-18 NOTE — Telephone Encounter (Signed)
Called patient to inform of positive chlamydia test and need for treatment.  Per dr Jimmye Norman need to call in azithromycin to patient preferred pharmacy.  I left message asking her to call me.

## 2019-07-22 ENCOUNTER — Other Ambulatory Visit: Payer: Self-pay

## 2019-07-22 ENCOUNTER — Encounter: Payer: Self-pay | Admitting: Emergency Medicine

## 2019-07-22 ENCOUNTER — Emergency Department
Admission: EM | Admit: 2019-07-22 | Discharge: 2019-07-22 | Disposition: A | Discharge status: Home / Self Care | Payer: Medicaid Other | Attending: Emergency Medicine | Admitting: Emergency Medicine

## 2019-07-22 DIAGNOSIS — Z20822 Contact with and (suspected) exposure to covid-19: Secondary | ICD-10-CM

## 2019-07-22 DIAGNOSIS — B349 Viral infection, unspecified: Secondary | ICD-10-CM | POA: Insufficient documentation

## 2019-07-22 DIAGNOSIS — Z20828 Contact with and (suspected) exposure to other viral communicable diseases: Secondary | ICD-10-CM

## 2019-07-22 DIAGNOSIS — F17210 Nicotine dependence, cigarettes, uncomplicated: Secondary | ICD-10-CM | POA: Diagnosis not present

## 2019-07-22 DIAGNOSIS — R509 Fever, unspecified: Secondary | ICD-10-CM | POA: Diagnosis present

## 2019-07-22 LAB — SARS CORONAVIRUS 2 (TAT 6-24 HRS): SARS Coronavirus 2: NEGATIVE

## 2019-07-22 NOTE — ED Notes (Signed)
See triage note States she has had sore throat low grade fever and cough for couple of days  Afebrile on arrival   Has been exposed to family members which tested positive for COVID

## 2019-07-22 NOTE — Discharge Instructions (Addendum)
Follow-up with your primary care provider if any continued problems.  Return to the emergency department if any severe worsening of your symptoms or difficulty breathing.  Take Tylenol or ibuprofen as needed for fever.  Increase fluids.  You are quarantined with your child in your home until 07/24/2019.  If your test is positive you will need an additional 10 days in quarantine.
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
## 2019-07-22 NOTE — ED Triage Notes (Signed)
Pt reports has been exposed to COVID positive people and for the last week has had symptoms such as intermittent fever, cough and bodyaches.

## 2019-12-04 ENCOUNTER — Encounter: Payer: Self-pay | Admitting: Emergency Medicine

## 2019-12-04 ENCOUNTER — Other Ambulatory Visit: Payer: Self-pay

## 2019-12-04 ENCOUNTER — Emergency Department
Admission: EM | Admit: 2019-12-04 | Discharge: 2019-12-04 | Disposition: A | Discharge status: Home / Self Care | Payer: Medicaid Other | Attending: Emergency Medicine | Admitting: Emergency Medicine

## 2019-12-04 DIAGNOSIS — Y939 Activity, unspecified: Secondary | ICD-10-CM | POA: Insufficient documentation

## 2019-12-04 DIAGNOSIS — T192XXA Foreign body in vulva and vagina, initial encounter: Secondary | ICD-10-CM

## 2019-12-04 DIAGNOSIS — Z79899 Other long term (current) drug therapy: Secondary | ICD-10-CM | POA: Insufficient documentation

## 2019-12-04 DIAGNOSIS — F17210 Nicotine dependence, cigarettes, uncomplicated: Secondary | ICD-10-CM | POA: Insufficient documentation

## 2019-12-04 DIAGNOSIS — Y929 Unspecified place or not applicable: Secondary | ICD-10-CM | POA: Diagnosis not present

## 2019-12-04 DIAGNOSIS — W458XXA Other foreign body or object entering through skin, initial encounter: Secondary | ICD-10-CM | POA: Insufficient documentation

## 2019-12-04 DIAGNOSIS — N3001 Acute cystitis with hematuria: Secondary | ICD-10-CM | POA: Insufficient documentation

## 2019-12-04 DIAGNOSIS — Y999 Unspecified external cause status: Secondary | ICD-10-CM | POA: Diagnosis not present

## 2019-12-04 DIAGNOSIS — R300 Dysuria: Secondary | ICD-10-CM | POA: Diagnosis present

## 2019-12-04 LAB — URINALYSIS, COMPLETE (UACMP) WITH MICROSCOPIC
Bilirubin Urine: NEGATIVE
Glucose, UA: NEGATIVE mg/dL
Ketones, ur: 5 mg/dL — AB
Nitrite: NEGATIVE
Protein, ur: 100 mg/dL — AB
Specific Gravity, Urine: 1.027 (ref 1.005–1.030)
WBC, UA: >50 WBC/hpf — ABNORMAL HIGH (ref 0–5)
pH: 5 (ref 5.0–8.0)

## 2019-12-04 LAB — WET PREP, GENITAL
Clue Cells Wet Prep HPF POC: NONE SEEN
Sperm: NONE SEEN
Trich, Wet Prep: NONE SEEN
Yeast Wet Prep HPF POC: NONE SEEN

## 2019-12-04 LAB — CHLAMYDIA/NGC RT PCR (ARMC ONLY)
Chlamydia Tr: NOT DETECTED
N gonorrhoeae: NOT DETECTED

## 2019-12-04 LAB — POCT PREGNANCY, URINE
Preg Test, Ur: NEGATIVE
Preg Test, Ur: NEGATIVE

## 2019-12-04 MED ORDER — CEPHALEXIN 500 MG PO CAPS: 500.0000 mg | ORAL_CAPSULE | Freq: Two times a day (BID) | ORAL | 0 refills | Status: AC

## 2019-12-04 NOTE — ED Notes (Signed)
Pt with burning with urination and pelvic pain. Daughter at bedside. Pt and daughter given juice and crackers per EDP, tolerating well.
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
## 2019-12-04 NOTE — Discharge Instructions (Signed)
Please follow up with primary care in about a week to make sure the infection has cleared.  Take the antibiotic until finished.  Return to the ER for symptoms that change or worsen or for other concerns if unable to schedule an appointment.

## 2020-01-19 ENCOUNTER — Emergency Department
Admission: EM | Admit: 2020-01-19 | Discharge: 2020-01-19 | Disposition: A | Discharge status: Home / Self Care | Payer: Medicaid Other | Attending: Emergency Medicine | Admitting: Emergency Medicine

## 2020-01-19 ENCOUNTER — Encounter: Payer: Self-pay | Admitting: *Deleted

## 2020-01-19 ENCOUNTER — Other Ambulatory Visit: Payer: Self-pay

## 2020-01-19 DIAGNOSIS — Z5321 Procedure and treatment not carried out due to patient leaving prior to being seen by health care provider: Secondary | ICD-10-CM | POA: Diagnosis not present

## 2020-01-19 DIAGNOSIS — R109 Unspecified abdominal pain: Secondary | ICD-10-CM | POA: Insufficient documentation

## 2020-01-19 LAB — CBC
HCT: 40.1 % (ref 36.0–46.0)
Hemoglobin: 13.3 g/dL (ref 12.0–15.0)
MCH: 32.2 pg (ref 26.0–34.0)
MCHC: 33.2 g/dL (ref 30.0–36.0)
MCV: 97.1 fL (ref 80.0–100.0)
Platelets: 163 10*3/uL (ref 150–400)
RBC: 4.13 MIL/uL (ref 3.87–5.11)
RDW: 12.4 % (ref 11.5–15.5)
WBC: 6.3 10*3/uL (ref 4.0–10.5)
nRBC: 0 % (ref 0.0–0.2)

## 2020-01-19 LAB — URINALYSIS, COMPLETE (UACMP) WITH MICROSCOPIC
Bilirubin Urine: NEGATIVE
Glucose, UA: NEGATIVE mg/dL
Hgb urine dipstick: NEGATIVE
Ketones, ur: NEGATIVE mg/dL
Leukocytes,Ua: NEGATIVE
Nitrite: NEGATIVE
Protein, ur: NEGATIVE mg/dL
Specific Gravity, Urine: 1.025 (ref 1.005–1.030)
pH: 6 (ref 5.0–8.0)

## 2020-01-19 LAB — COMPREHENSIVE METABOLIC PANEL
ALT: 21 U/L (ref 0–44)
AST: 28 U/L (ref 15–41)
Albumin: 4.1 g/dL (ref 3.5–5.0)
Alkaline Phosphatase: 59 U/L (ref 38–126)
Anion gap: 7 (ref 5–15)
BUN: 20 mg/dL (ref 6–20)
CO2: 24 mmol/L (ref 22–32)
Calcium: 9.4 mg/dL (ref 8.9–10.3)
Chloride: 101 mmol/L (ref 98–111)
Creatinine, Ser: 0.88 mg/dL (ref 0.44–1.00)
GFR calc Af Amer: >60 mL/min (ref >60)
GFR calc non Af Amer: >60 mL/min (ref >60)
Glucose, Bld: 78 mg/dL (ref 70–99)
Potassium: 4.9 mmol/L (ref 3.5–5.1)
Sodium: 132 mmol/L — ABNORMAL LOW (ref 135–145)
Total Bilirubin: 1.3 mg/dL — ABNORMAL HIGH (ref 0.3–1.2)
Total Protein: 7.7 g/dL (ref 6.5–8.1)

## 2020-01-19 LAB — POCT PREGNANCY, URINE: Preg Test, Ur: POSITIVE — AB

## 2020-01-19 LAB — LIPASE, BLOOD: Lipase: 32 U/L (ref 11–51)

## 2020-01-19 MED ORDER — SODIUM CHLORIDE 0.9% FLUSH: 3.0000 mL | Freq: Once | INTRAVENOUS | Status: DC

## 2020-01-19 NOTE — ED Notes (Signed)
poct pregnancy Positive 

## 2020-01-19 NOTE — ED Triage Notes (Addendum)
Pt has lower abd pain for 2 days.  Pt has vaginal discharge.  No vag bleeding. Denies urinary sx.  Pt alert.

## 2020-01-20 ENCOUNTER — Telehealth: Payer: Self-pay | Admitting: Emergency Medicine

## 2020-01-20 NOTE — Telephone Encounter (Signed)
Called patient due to lwot to inquire about condition and follow up plans. She says she is not better and plans to return today.

## 2020-01-29 ENCOUNTER — Emergency Department
Admission: EM | Admit: 2020-01-29 | Discharge: 2020-01-29 | Disposition: A | Discharge status: Home / Self Care | Payer: Medicaid Other | Attending: Emergency Medicine | Admitting: Emergency Medicine

## 2020-01-29 ENCOUNTER — Emergency Department: Payer: Medicaid Other

## 2020-01-29 ENCOUNTER — Other Ambulatory Visit: Payer: Self-pay

## 2020-01-29 DIAGNOSIS — R1030 Lower abdominal pain, unspecified: Secondary | ICD-10-CM | POA: Insufficient documentation

## 2020-01-29 DIAGNOSIS — O26891 Other specified pregnancy related conditions, first trimester: Secondary | ICD-10-CM | POA: Insufficient documentation

## 2020-01-29 DIAGNOSIS — Z3A01 Less than 8 weeks gestation of pregnancy: Secondary | ICD-10-CM | POA: Insufficient documentation

## 2020-01-29 DIAGNOSIS — O99331 Smoking (tobacco) complicating pregnancy, first trimester: Secondary | ICD-10-CM | POA: Insufficient documentation

## 2020-01-29 DIAGNOSIS — N898 Other specified noninflammatory disorders of vagina: Secondary | ICD-10-CM | POA: Insufficient documentation

## 2020-01-29 DIAGNOSIS — N939 Abnormal uterine and vaginal bleeding, unspecified: Secondary | ICD-10-CM

## 2020-01-29 DIAGNOSIS — O3461 Maternal care for abnormality of vagina, first trimester: Secondary | ICD-10-CM | POA: Diagnosis not present

## 2020-01-29 DIAGNOSIS — O4691 Antepartum hemorrhage, unspecified, first trimester: Secondary | ICD-10-CM | POA: Diagnosis not present

## 2020-01-29 DIAGNOSIS — F17210 Nicotine dependence, cigarettes, uncomplicated: Secondary | ICD-10-CM | POA: Insufficient documentation

## 2020-01-29 DIAGNOSIS — O4690 Antepartum hemorrhage, unspecified, unspecified trimester: Secondary | ICD-10-CM

## 2020-01-29 LAB — COMPREHENSIVE METABOLIC PANEL
ALT: 13 U/L (ref 0–44)
AST: 20 U/L (ref 15–41)
Albumin: 3.8 g/dL (ref 3.5–5.0)
Alkaline Phosphatase: 48 U/L (ref 38–126)
Anion gap: 7 (ref 5–15)
BUN: 7 mg/dL (ref 6–20)
CO2: 23 mmol/L (ref 22–32)
Calcium: 9.2 mg/dL (ref 8.9–10.3)
Chloride: 105 mmol/L (ref 98–111)
Creatinine, Ser: 0.57 mg/dL (ref 0.44–1.00)
GFR calc Af Amer: >60 mL/min (ref >60)
GFR calc non Af Amer: >60 mL/min (ref >60)
Glucose, Bld: 84 mg/dL (ref 70–99)
Potassium: 3.5 mmol/L (ref 3.5–5.1)
Sodium: 135 mmol/L (ref 135–145)
Total Bilirubin: 0.9 mg/dL (ref 0.3–1.2)
Total Protein: 7 g/dL (ref 6.5–8.1)

## 2020-01-29 LAB — URINALYSIS, COMPLETE (UACMP) WITH MICROSCOPIC
Bacteria, UA: NONE SEEN
Bilirubin Urine: NEGATIVE
Glucose, UA: NEGATIVE mg/dL
Hgb urine dipstick: NEGATIVE
Ketones, ur: NEGATIVE mg/dL
Leukocytes,Ua: NEGATIVE
Nitrite: NEGATIVE
Protein, ur: NEGATIVE mg/dL
Specific Gravity, Urine: 1.012 (ref 1.005–1.030)
pH: 7 (ref 5.0–8.0)

## 2020-01-29 LAB — CBC
HCT: 36.5 % (ref 36.0–46.0)
Hemoglobin: 12.4 g/dL (ref 12.0–15.0)
MCH: 32.6 pg (ref 26.0–34.0)
MCHC: 34 g/dL (ref 30.0–36.0)
MCV: 96.1 fL (ref 80.0–100.0)
Platelets: 156 10*3/uL (ref 150–400)
RBC: 3.8 MIL/uL — ABNORMAL LOW (ref 3.87–5.11)
RDW: 12.3 % (ref 11.5–15.5)
WBC: 4.5 10*3/uL (ref 4.0–10.5)
nRBC: 0 % (ref 0.0–0.2)

## 2020-01-29 LAB — LIPASE, BLOOD: Lipase: 27 U/L (ref 11–51)

## 2020-01-29 LAB — POCT PREGNANCY, URINE: Preg Test, Ur: POSITIVE — AB

## 2020-01-29 LAB — HCG, QUANTITATIVE, PREGNANCY: hCG, Beta Chain, Quant, S: 18965 m[IU]/mL — ABNORMAL HIGH (ref <5)

## 2020-01-29 NOTE — Discharge Instructions (Addendum)
Your ultrasound shows an early pregnancy in the uterus.    Return to the ER for new, worsening, or persistent bleeding, passage of clots or other material, new or worsening abdominal pain or cramps, vomiting, weakness, or any other new or worsening symptoms that concern you.  Follow-up with your OB/GYN as scheduled.

## 2020-01-29 NOTE — ED Notes (Signed)
See triage note  Presents with vaginal bleeding with some abd discomfort  Pt is approx 4 weeks preg

## 2020-01-29 NOTE — ED Triage Notes (Signed)
Pt comes POV [redacted] weeks pregnant. Pt had some vaginal bleeding and pelvic discomfort this morning. G2P1.
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
## 2020-06-19 ENCOUNTER — Emergency Department
Admission: EM | Admit: 2020-06-19 | Discharge: 2020-06-19 | Disposition: A | Discharge status: Home / Self Care | Payer: Medicaid Other | Attending: Emergency Medicine | Admitting: Emergency Medicine

## 2020-06-19 ENCOUNTER — Encounter: Payer: Self-pay | Admitting: Emergency Medicine

## 2020-06-19 ENCOUNTER — Other Ambulatory Visit: Payer: Self-pay

## 2020-06-19 DIAGNOSIS — M545 Low back pain, unspecified: Secondary | ICD-10-CM | POA: Insufficient documentation

## 2020-06-19 DIAGNOSIS — F17210 Nicotine dependence, cigarettes, uncomplicated: Secondary | ICD-10-CM | POA: Insufficient documentation

## 2020-06-19 DIAGNOSIS — Z3202 Encounter for pregnancy test, result negative: Secondary | ICD-10-CM | POA: Insufficient documentation

## 2020-06-19 DIAGNOSIS — B9689 Other specified bacterial agents as the cause of diseases classified elsewhere: Secondary | ICD-10-CM | POA: Insufficient documentation

## 2020-06-19 DIAGNOSIS — N898 Other specified noninflammatory disorders of vagina: Secondary | ICD-10-CM | POA: Insufficient documentation

## 2020-06-19 DIAGNOSIS — R350 Frequency of micturition: Secondary | ICD-10-CM | POA: Insufficient documentation

## 2020-06-19 LAB — URINALYSIS, COMPLETE (UACMP) WITH MICROSCOPIC
Bacteria, UA: NONE SEEN
Bilirubin Urine: NEGATIVE
Glucose, UA: NEGATIVE mg/dL
Hgb urine dipstick: NEGATIVE
Ketones, ur: NEGATIVE mg/dL
Leukocytes,Ua: NEGATIVE
Nitrite: NEGATIVE
Protein, ur: NEGATIVE mg/dL
Specific Gravity, Urine: 1.016 (ref 1.005–1.030)
pH: 6 (ref 5.0–8.0)

## 2020-06-19 LAB — WET PREP, GENITAL
Clue Cells Wet Prep HPF POC: NONE SEEN
Sperm: NONE SEEN
Trich, Wet Prep: NONE SEEN
Yeast Wet Prep HPF POC: NONE SEEN

## 2020-06-19 LAB — CHLAMYDIA/NGC RT PCR (ARMC ONLY)
Chlamydia Tr: NOT DETECTED
N gonorrhoeae: NOT DETECTED

## 2020-06-19 LAB — POC URINE PREG, ED: Preg Test, Ur: NEGATIVE
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
## 2020-06-19 NOTE — ED Triage Notes (Signed)
Pt reports would like to be checked out because she has had some vaginal discharge and back pain. Pt reports does have urinary frequency also.

## 2020-06-19 NOTE — Discharge Instructions (Signed)
Follow-up with your primary care provider if any continued problems.  At the time of your discharge test results have been negative.  If there are any positive test results you will be called on the phone that you provided registration.

## 2020-07-05 ENCOUNTER — Other Ambulatory Visit: Payer: Self-pay

## 2020-07-05 ENCOUNTER — Encounter: Payer: Self-pay | Admitting: Emergency Medicine

## 2020-07-05 ENCOUNTER — Emergency Department
Admission: EM | Admit: 2020-07-05 | Discharge: 2020-07-05 | Disposition: A | Discharge status: Home / Self Care | Payer: Self-pay | Attending: Emergency Medicine | Admitting: Emergency Medicine

## 2020-07-05 DIAGNOSIS — N760 Acute vaginitis: Secondary | ICD-10-CM | POA: Insufficient documentation

## 2020-07-05 DIAGNOSIS — F17210 Nicotine dependence, cigarettes, uncomplicated: Secondary | ICD-10-CM | POA: Insufficient documentation

## 2020-07-05 DIAGNOSIS — B9689 Other specified bacterial agents as the cause of diseases classified elsewhere: Secondary | ICD-10-CM | POA: Insufficient documentation

## 2020-07-05 LAB — URINALYSIS, COMPLETE (UACMP) WITH MICROSCOPIC
Bilirubin Urine: NEGATIVE
Glucose, UA: NEGATIVE mg/dL
Hgb urine dipstick: NEGATIVE
Ketones, ur: NEGATIVE mg/dL
Nitrite: NEGATIVE
Protein, ur: NEGATIVE mg/dL
Specific Gravity, Urine: 1.014 (ref 1.005–1.030)
pH: 7 (ref 5.0–8.0)

## 2020-07-05 LAB — COMPREHENSIVE METABOLIC PANEL
ALT: 24 U/L (ref 0–44)
AST: 18 U/L (ref 15–41)
Albumin: 4 g/dL (ref 3.5–5.0)
Alkaline Phosphatase: 62 U/L (ref 38–126)
Anion gap: 1 — ABNORMAL LOW (ref 5–15)
BUN: 16 mg/dL (ref 6–20)
CO2: 26 mmol/L (ref 22–32)
Calcium: 8.7 mg/dL — ABNORMAL LOW (ref 8.9–10.3)
Chloride: 104 mmol/L (ref 98–111)
Creatinine, Ser: 1.11 mg/dL — ABNORMAL HIGH (ref 0.44–1.00)
GFR, Estimated: >60 mL/min (ref >60)
Glucose, Bld: 93 mg/dL (ref 70–99)
Potassium: 4.2 mmol/L (ref 3.5–5.1)
Sodium: 131 mmol/L — ABNORMAL LOW (ref 135–145)
Total Bilirubin: 0.6 mg/dL (ref 0.3–1.2)
Total Protein: 7.4 g/dL (ref 6.5–8.1)

## 2020-07-05 LAB — WET PREP, GENITAL
Sperm: NONE SEEN
Trich, Wet Prep: NONE SEEN
Yeast Wet Prep HPF POC: NONE SEEN

## 2020-07-05 LAB — CHLAMYDIA/NGC RT PCR (ARMC ONLY)
Chlamydia Tr: NOT DETECTED
N gonorrhoeae: DETECTED — AB

## 2020-07-05 LAB — CBC
HCT: 39.7 % (ref 36.0–46.0)
Hemoglobin: 13.3 g/dL (ref 12.0–15.0)
MCH: 33.3 pg (ref 26.0–34.0)
MCHC: 33.5 g/dL (ref 30.0–36.0)
MCV: 99.3 fL (ref 80.0–100.0)
Platelets: 173 10*3/uL (ref 150–400)
RBC: 4 MIL/uL (ref 3.87–5.11)
RDW: 12.2 % (ref 11.5–15.5)
WBC: 5.3 10*3/uL (ref 4.0–10.5)
nRBC: 0 % (ref 0.0–0.2)

## 2020-07-05 LAB — POC URINE PREG, ED: Preg Test, Ur: NEGATIVE

## 2020-07-05 LAB — LIPASE, BLOOD: Lipase: 38 U/L (ref 11–51)

## 2020-07-05 MED ORDER — METRONIDAZOLE 500 MG PO TABS
500.0000 mg | ORAL_TABLET | Freq: Two times a day (BID) | ORAL | 0 refills | Status: AC
Start: 2020-07-05 — End: 2020-07-12
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
## 2020-07-05 NOTE — ED Triage Notes (Signed)
Pt via POV from home. Pt states she having lower abdominal pain, vaginal itching, vaginal d/c that's milky white, pt states it painful to have intercourse. States it has been going on two weeks but progressively gotten worse. Denies vaginal bleeding. Pt is A&OX4 and NAD.

## 2020-07-09 ENCOUNTER — Telehealth: Payer: Self-pay | Admitting: Emergency Medicine

## 2020-07-09 NOTE — Telephone Encounter (Signed)
Called patient to inform of positive gonorrhea result and need for treatment.  She can return here or go to urgent care.  I left a message asking her to call me, or if it is later she can call main ED number.

## 2020-07-12 NOTE — Telephone Encounter (Signed)
Called patient again regarding std results and need for treatment.  Left message.

## 2020-07-21 ENCOUNTER — Emergency Department: Payer: Self-pay

## 2020-07-21 ENCOUNTER — Encounter: Payer: Self-pay | Admitting: Emergency Medicine

## 2020-07-21 ENCOUNTER — Emergency Department
Admission: EM | Admit: 2020-07-21 | Discharge: 2020-07-21 | Disposition: A | Discharge status: Home / Self Care | Payer: Self-pay | Attending: Emergency Medicine | Admitting: Emergency Medicine

## 2020-07-21 ENCOUNTER — Other Ambulatory Visit: Payer: Self-pay

## 2020-07-21 DIAGNOSIS — W25XXXA Contact with sharp glass, initial encounter: Secondary | ICD-10-CM | POA: Insufficient documentation

## 2020-07-21 DIAGNOSIS — S90851A Superficial foreign body, right foot, initial encounter: Secondary | ICD-10-CM | POA: Insufficient documentation

## 2020-07-21 DIAGNOSIS — Z23 Encounter for immunization: Secondary | ICD-10-CM | POA: Insufficient documentation

## 2020-07-21 DIAGNOSIS — F17210 Nicotine dependence, cigarettes, uncomplicated: Secondary | ICD-10-CM | POA: Insufficient documentation

## 2020-07-21 DIAGNOSIS — M795 Residual foreign body in soft tissue: Secondary | ICD-10-CM

## 2020-07-21 MED ORDER — TETANUS-DIPHTH-ACELL PERTUSSIS 5-2.5-18.5 LF-MCG/0.5 IM SUSY
0.5000 mL | PREFILLED_SYRINGE | Freq: Once | INTRAMUSCULAR | Status: AC
  Administered 2020-07-21: 0.5 mL via INTRAMUSCULAR
  Filled 2020-07-21: qty 0.5

## 2020-07-21 MED ORDER — LIDOCAINE HCL (PF) 1 % IJ SOLN
5.0000 mL | Freq: Once | INTRAMUSCULAR | Status: DC
  Filled 2020-07-21: qty 5

## 2020-07-21 MED ORDER — PENTAFLUOROPROP-TETRAFLUOROETH EX AERO
1.0000 "application " | INHALATION_SPRAY | CUTANEOUS | Status: DC | PRN
  Filled 2020-07-21 (×3): qty 30

## 2020-07-21 NOTE — ED Triage Notes (Signed)
Pt to ED via POV with c/o L foot pain, pt states stepped on glass approx 2 weeks ago. Pt states was unable to get the glass out, c/o increasing pain since then.
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
## 2020-07-21 NOTE — Discharge Instructions (Addendum)
I Believe we have successfully removed a small sliver of glass from the bottom of your foot.  Keep the wound clean, dry, and covered.  Use warm Epson salt foot soaks to promote healing and remove any residual foreign body.  Follow-up with your primary provider return to the ED if needed.   We will contact you regarding any positive lab results related to the needlestick.

## 2020-11-07 ENCOUNTER — Encounter: Payer: Self-pay | Admitting: Emergency Medicine

## 2020-11-07 ENCOUNTER — Other Ambulatory Visit: Payer: Self-pay

## 2020-11-07 ENCOUNTER — Emergency Department
Admission: EM | Admit: 2020-11-07 | Discharge: 2020-11-07 | Disposition: A | Discharge status: Home / Self Care | Payer: Self-pay | Attending: Emergency Medicine | Admitting: Emergency Medicine

## 2020-11-07 DIAGNOSIS — N760 Acute vaginitis: Secondary | ICD-10-CM | POA: Insufficient documentation

## 2020-11-07 DIAGNOSIS — B9689 Other specified bacterial agents as the cause of diseases classified elsewhere: Secondary | ICD-10-CM

## 2020-11-07 DIAGNOSIS — F17210 Nicotine dependence, cigarettes, uncomplicated: Secondary | ICD-10-CM | POA: Insufficient documentation

## 2020-11-07 DIAGNOSIS — N3001 Acute cystitis with hematuria: Secondary | ICD-10-CM | POA: Insufficient documentation

## 2020-11-07 LAB — URINALYSIS, ROUTINE W REFLEX MICROSCOPIC
Bilirubin Urine: NEGATIVE
Glucose, UA: NEGATIVE mg/dL
Ketones, ur: NEGATIVE mg/dL
Nitrite: NEGATIVE
Protein, ur: NEGATIVE mg/dL
Specific Gravity, Urine: 1.023 (ref 1.005–1.030)
WBC, UA: >50 WBC/hpf — ABNORMAL HIGH (ref 0–5)
pH: 5 (ref 5.0–8.0)

## 2020-11-07 LAB — WET PREP, GENITAL
Sperm: NONE SEEN
Trich, Wet Prep: NONE SEEN
Yeast Wet Prep HPF POC: NONE SEEN

## 2020-11-07 LAB — CHLAMYDIA/NGC RT PCR (ARMC ONLY)
Chlamydia Tr: NOT DETECTED
N gonorrhoeae: NOT DETECTED

## 2020-11-07 LAB — POC URINE PREG, ED: Preg Test, Ur: NEGATIVE

## 2020-11-07 MED ORDER — NITROFURANTOIN MONOHYD MACRO 100 MG PO CAPS
100.0000 mg | ORAL_CAPSULE | Freq: Once | ORAL | Status: AC
  Administered 2020-11-07: 100 mg via ORAL
  Filled 2020-11-07: qty 1

## 2020-11-07 MED ORDER — NITROFURANTOIN MONOHYD MACRO 100 MG PO CAPS: 100.0000 mg | ORAL_CAPSULE | Freq: Two times a day (BID) | ORAL | 0 refills | Status: AC

## 2020-11-07 MED ORDER — METRONIDAZOLE 500 MG PO TABS: 500.0000 mg | ORAL_TABLET | Freq: Two times a day (BID) | ORAL | 0 refills | Status: AC

## 2020-11-07 MED ORDER — METRONIDAZOLE 500 MG PO TABS
500.0000 mg | ORAL_TABLET | Freq: Once | ORAL | Status: AC
  Administered 2020-11-07: 500 mg via ORAL
  Filled 2020-11-07: qty 1

## 2020-11-07 MED ORDER — NITROFURANTOIN MONOHYD MACRO 100 MG PO CAPS: 100.0000 mg | ORAL_CAPSULE | Freq: Two times a day (BID) | ORAL | 0 refills | Status: DC

## 2020-11-07 MED ORDER — METRONIDAZOLE 500 MG PO TABS: 500.0000 mg | ORAL_TABLET | Freq: Two times a day (BID) | ORAL | 0 refills | Status: DC

## 2020-11-07 NOTE — Discharge Instructions (Addendum)
You were seen today for pelvic pressure and vaginal discharge.  You have been diagnosed with a UTI and bacterial vaginosis.  I have sent in prescriptions for Macrobid to take twice daily for the next 3 days for treatment of your UTI.  I have sent another antibiotic for you to take twice daily for 5 days for your bacterial vaginosis.  Please avoid alcohol for the next 5 days.  Follow-up with your PCP if symptoms persist or worsen.
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
## 2020-11-07 NOTE — ED Triage Notes (Signed)
Pt to ED via POV with c/o vaginal pain. Pt states pain x 1 month intermittent and now is getting a little worse. Pt states white vaginal discharge that is malodorous, pt pain with sexual intercourse. Pt states pressure feeling with urination at this time. Pt denies recent new sexual partners at this time, pt endorses unprotected sex.

## 2021-03-07 ENCOUNTER — Encounter: Payer: Self-pay | Admitting: Emergency Medicine

## 2021-03-07 ENCOUNTER — Other Ambulatory Visit: Payer: Self-pay

## 2021-03-07 ENCOUNTER — Emergency Department
Admission: EM | Admit: 2021-03-07 | Discharge: 2021-03-07 | Disposition: A | Discharge status: Home / Self Care | Payer: Self-pay | Attending: Emergency Medicine | Admitting: Emergency Medicine

## 2021-03-07 DIAGNOSIS — W19XXXA Unspecified fall, initial encounter: Secondary | ICD-10-CM | POA: Insufficient documentation

## 2021-03-07 DIAGNOSIS — S51012A Laceration without foreign body of left elbow, initial encounter: Secondary | ICD-10-CM | POA: Insufficient documentation

## 2021-03-07 DIAGNOSIS — A64 Unspecified sexually transmitted disease: Secondary | ICD-10-CM | POA: Insufficient documentation

## 2021-03-07 DIAGNOSIS — F17210 Nicotine dependence, cigarettes, uncomplicated: Secondary | ICD-10-CM | POA: Insufficient documentation

## 2021-03-07 MED ORDER — DOXYCYCLINE HYCLATE 100 MG PO TBEC: 100.0000 mg | DELAYED_RELEASE_TABLET | Freq: Two times a day (BID) | ORAL | 0 refills | Status: AC

## 2021-03-07 MED ORDER — PENICILLIN G BENZATHINE 1200000 UNIT/2ML IM SUSY
2.4000 10*6.[IU] | PREFILLED_SYRINGE | Freq: Once | INTRAMUSCULAR | Status: AC
  Administered 2021-03-07: 2.4 10*6.[IU] via INTRAMUSCULAR
  Filled 2021-03-07: qty 4

## 2021-03-07 MED ORDER — LIDOCAINE-EPINEPHRINE 2 %-1:100000 IJ SOLN
20.0000 mL | Freq: Once | INTRAMUSCULAR | Status: AC
  Administered 2021-03-07: 20 mL

## 2021-03-07 MED ORDER — METRONIDAZOLE 500 MG PO TABS
2000.0000 mg | ORAL_TABLET | Freq: Once | ORAL | Status: AC
  Administered 2021-03-07: 2000 mg via ORAL
  Filled 2021-03-07: qty 4

## 2021-03-07 MED ORDER — LIDOCAINE HCL (PF) 1 % IJ SOLN
INTRAMUSCULAR | Status: AC
  Filled 2021-03-07: qty 10

## 2021-03-07 MED ORDER — CEFTRIAXONE SODIUM 1 G IJ SOLR
500.0000 mg | Freq: Once | INTRAMUSCULAR | Status: AC
  Administered 2021-03-07: 500 mg via INTRAMUSCULAR
  Filled 2021-03-07: qty 10

## 2021-03-07 NOTE — Discharge Instructions (Addendum)
Take Doxycycline twice daily for seven days.  Please abstain from unprotected sex for seven days.  Have sutures removed by primary care in seven days.  Return if you experience redness or streaking around the wound site.
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
## 2021-03-07 NOTE — ED Triage Notes (Addendum)
Pt comes into the ED via POV c/o laceration to the left elbow.  Pt states the vent cover wasn't on the vent in the floor and she stepped back and her foot went it and she fell into the window.  Pt has all bleeding under control at this time. Pt also states she was diagnosed with syphilis and gonorrhea. And cant be seen for treatment for 2 weeks and would like to go ahead and obtain treatment.

## 2021-03-08 ENCOUNTER — Ambulatory Visit: Payer: Self-pay

## 2021-03-21 ENCOUNTER — Ambulatory Visit: Payer: Self-pay

## 2021-04-14 ENCOUNTER — Other Ambulatory Visit: Payer: Self-pay

## 2021-04-14 ENCOUNTER — Encounter: Payer: Self-pay | Admitting: *Deleted

## 2021-04-14 DIAGNOSIS — Z5321 Procedure and treatment not carried out due to patient leaving prior to being seen by health care provider: Secondary | ICD-10-CM | POA: Insufficient documentation

## 2021-04-14 DIAGNOSIS — N898 Other specified noninflammatory disorders of vagina: Secondary | ICD-10-CM | POA: Insufficient documentation

## 2021-04-14 DIAGNOSIS — M545 Low back pain, unspecified: Secondary | ICD-10-CM | POA: Insufficient documentation

## 2021-04-14 DIAGNOSIS — R39198 Other difficulties with micturition: Secondary | ICD-10-CM | POA: Insufficient documentation

## 2021-04-14 LAB — URINALYSIS, ROUTINE W REFLEX MICROSCOPIC
Bilirubin Urine: NEGATIVE
Glucose, UA: NEGATIVE mg/dL
Hgb urine dipstick: NEGATIVE
Ketones, ur: 5 mg/dL — AB
Leukocytes,Ua: NEGATIVE
Nitrite: NEGATIVE
Protein, ur: NEGATIVE mg/dL
Specific Gravity, Urine: 1.029 (ref 1.005–1.030)
pH: 6 (ref 5.0–8.0)

## 2021-04-14 LAB — POC URINE PREG, ED: Preg Test, Ur: NEGATIVE

## 2021-04-14 NOTE — ED Triage Notes (Signed)
2-3 weeks ago rec'd treatment for syphilis/ gonorrhea/ chlamydia. Reports she was notified today by her partner she needed to be treated again. Whitish discharge that itches. Lower back pain, pressure when she urinates.

## 2021-04-15 ENCOUNTER — Emergency Department
Admission: EM | Admit: 2021-04-15 | Discharge: 2021-04-15 | Disposition: A | Discharge status: Home / Self Care | Payer: Self-pay | Attending: Emergency Medicine | Admitting: Emergency Medicine

## 2021-04-15 NOTE — ED Notes (Signed)
No answer when called several times from lobby 

## 2021-09-27 ENCOUNTER — Emergency Department
Admission: EM | Admit: 2021-09-27 | Discharge: 2021-09-27 | Disposition: A | Discharge status: Home / Self Care | Payer: Self-pay | Attending: Emergency Medicine | Admitting: Emergency Medicine

## 2021-09-27 ENCOUNTER — Other Ambulatory Visit: Payer: Self-pay

## 2021-09-27 DIAGNOSIS — N946 Dysmenorrhea, unspecified: Secondary | ICD-10-CM | POA: Insufficient documentation

## 2021-09-27 DIAGNOSIS — Z20822 Contact with and (suspected) exposure to covid-19: Secondary | ICD-10-CM | POA: Insufficient documentation

## 2021-09-27 DIAGNOSIS — H9209 Otalgia, unspecified ear: Secondary | ICD-10-CM | POA: Insufficient documentation

## 2021-09-27 LAB — RESP PANEL BY RT-PCR (FLU A&B, COVID) ARPGX2
Influenza A by PCR: NEGATIVE
Influenza B by PCR: NEGATIVE
SARS Coronavirus 2 by RT PCR: NEGATIVE

## 2021-09-27 LAB — WET PREP, GENITAL
Clue Cells Wet Prep HPF POC: NONE SEEN
Sperm: NONE SEEN
Trich, Wet Prep: NONE SEEN
WBC, Wet Prep HPF POC: >=10 — AB (ref <10)
Yeast Wet Prep HPF POC: NONE SEEN

## 2021-09-27 LAB — URINALYSIS, COMPLETE (UACMP) WITH MICROSCOPIC
Bacteria, UA: NONE SEEN
Bilirubin Urine: NEGATIVE
Glucose, UA: NEGATIVE mg/dL
Ketones, ur: NEGATIVE mg/dL
Nitrite: NEGATIVE
Protein, ur: NEGATIVE mg/dL
Specific Gravity, Urine: 1.011 (ref 1.005–1.030)
pH: 6 (ref 5.0–8.0)

## 2021-09-27 LAB — CHLAMYDIA/NGC RT PCR (ARMC ONLY)
Chlamydia Tr: NOT DETECTED
N gonorrhoeae: NOT DETECTED

## 2021-09-27 LAB — POC URINE PREG, ED: Preg Test, Ur: NEGATIVE

## 2021-09-27 LAB — GROUP A STREP BY PCR: Group A Strep by PCR: NOT DETECTED

## 2021-09-27 NOTE — Discharge Instructions (Signed)
Your exam and labs are normal and reassuring at this time.  Your symptoms likely represent some mild cramping secondary to regular menstrual period flow.  Follow-up to primary provider for ongoing symptoms.

## 2021-09-27 NOTE — ED Triage Notes (Addendum)
Pt in from home due to ear pain, throat pain, itchiness at vagina, pain in lower back and pelvic area, reports recent blood in urine; states last menstrual cycle last week. Pt in NAD.

## 2021-09-27 NOTE — ED Provider Triage Note (Signed)
Emergency Medicine Provider Triage Evaluation Note  Kimberly Powers , a 26 y.o. female  was evaluated in triage.  Pt complains of pharyngitis, ear fullness and changes in vaginal discharge.  Patient has had unprotected sex recently and is concerned for STDs.  Review of Systems  Positive: Patient has pharyngitis, changes in vaginal discharge Negative: No chest pain or abdominal pain.  Physical Exam  There were no vitals taken for this visit. Gen:   Awake, no distress   Resp:  Normal effort  MSK:   Moves extremities without difficulty  Other:    Medical Decision Making  Medically screening exam initiated at 6:07 PM.  Appropriate orders placed.  [place] was informed that the remainder of the evaluation will be completed by another provider, this initial triage assessment does not replace that evaluation, and the importance of remaining in the ED until their evaluation is complete.     Pia Mau [place], [place] 09/27/21 1809

## 2021-09-27 NOTE — ED Provider Notes (Signed)
[facility] Emergency Department Provider Note     Event Date/Time   First MD Initiated Contact with Patient 09/27/21 1827     (approximate)   History   Vaginal Bleeding, Otalgia, and Vaginal Itching   HPI  Kimberly Powers is a 26 y.o. female presents as up to the ED for multiple complaints including ear pain, throat pain, vaginal itchiness, as well as concern for possible UTI.  Patient reports that she has noted blood when she wipes after urination.  She reports that her LMP started last week.  She denies any frank fevers, chills, or sweats.  Patient presents with her significant other for evaluation of her complaints.  Physical Exam   Triage Vital Signs: ED Triage Vitals  Enc Vitals Group     BP 09/27/21 1805 122/83     Pulse Rate 09/27/21 1805 66     Resp 09/27/21 1805 16     Temp 09/27/21 1805 98.3 F (36.8 C)     Temp Source 09/27/21 1805 Oral     SpO2 09/27/21 1805 100 %     Weight 09/27/21 1808 115 lb (52.2 kg)     Height 09/27/21 1808 5\' 3"  (1.6 m)     Head Circumference --      Peak Flow --      Pain Score 09/27/21 1808 10     Pain Loc --      Pain Edu? --      Excl. in GC? --     Most recent vital signs: Vitals:   09/27/21 1805  BP: 122/83  Pulse: 66  Resp: 16  Temp: 98.3 F (36.8 C)  SpO2: 100%    General Awake, no distress.  HEENT NCAT. PERRL. EOMI. No rhinorrhea. Mucous membranes are moist. CV:  Good peripheral perfusion.  RESP:  Normal effort.  ABD:  No distention.  GU:   Normal external genitalia.  Cervical os is closed with dark blood in the vault and coming from the os consistent with active vaginal bleeding.  No CMT or adnexal masses appreciated.   ED Results / Procedures / Treatments   Labs (all labs ordered are listed, but only abnormal results are displayed) Labs Reviewed  WET PREP, GENITAL - Abnormal; Notable for the following components:      Result Value   WBC, Wet Prep HPF POC >=10 (*)    All  other components within normal limits  URINALYSIS, COMPLETE (UACMP) WITH MICROSCOPIC - Abnormal; Notable for the following components:   Color, Urine YELLOW (*)    APPearance CLEAR (*)    Hgb urine dipstick MODERATE (*)    Leukocytes,Ua SMALL (*)    All other components within normal limits  RESP PANEL BY RT-PCR (FLU A&B, COVID) ARPGX2  GROUP A STREP BY PCR  CHLAMYDIA/NGC RT PCR ([facility] ONLY)            POC URINE PREG, ED     EKG   RADIOLOGY   No results found.   PROCEDURES:  Critical Care performed: No  Procedures   MEDICATIONS ORDERED IN ED: Medications - No data to display   IMPRESSION / MDM / ASSESSMENT AND PLAN / ED COURSE  I reviewed the triage vital signs and the nursing notes.                              Differential diagnosis includes, but is not limited to, UTI,  AOM, otitis externa, menstrual bleeding, pregnancy-related concerns, strep throat, vaginitis  Patient with ED evaluation of multiple complaints including ear pain, throat pain, vaginal itching, low back pain, and concern for hematuria.  Patient is evaluated for complaints in the ED and found to have overall reassuring work-up.  No laboratory evidence of UTI, strep throat, viral URI including COVID/flu, vaginitis or clinical evidence of AOM or otitis externa.  Patient's diagnosis is consistent with menstrual bleeding.  Patient will follow [place] results through call [place].  Patient is to follow up with child should community clinic or her PCP as needed or otherwise directed. Patient is given ED precautions to return to the ED for any worsening or new symptoms.   FINAL CLINICAL IMPRESSION(S) / ED DIAGNOSES   Final diagnoses:  Moderate cramps with menses     Rx / DC Orders   ED Discharge Orders     None        Note:  This document was prepared using Dragon voice recognition software and may include unintentional dictation errors.    Melvenia Needles, PA-C 09/27/21 1913    Rada Hay, MD 09/27/21 2214

## 2021-12-06 ENCOUNTER — Other Ambulatory Visit: Payer: Self-pay

## 2021-12-06 DIAGNOSIS — A549 Gonococcal infection, unspecified: Secondary | ICD-10-CM | POA: Insufficient documentation

## 2021-12-06 DIAGNOSIS — Z202 Contact with and (suspected) exposure to infections with a predominantly sexual mode of transmission: Secondary | ICD-10-CM | POA: Insufficient documentation

## 2021-12-06 DIAGNOSIS — N390 Urinary tract infection, site not specified: Secondary | ICD-10-CM | POA: Insufficient documentation

## 2021-12-06 DIAGNOSIS — N760 Acute vaginitis: Secondary | ICD-10-CM | POA: Insufficient documentation

## 2021-12-06 NOTE — ED Triage Notes (Signed)
Pt has been having vaginal itching for weeks. Pt now has noticed bumps on her labia this week along with sores in her mouth and nose. Pt states she has had no new partners and her partner was recently tested and came back clean.  ?

## 2021-12-07 ENCOUNTER — Telehealth: Payer: Self-pay | Admitting: Family Medicine

## 2021-12-07 ENCOUNTER — Other Ambulatory Visit: Payer: Self-pay

## 2021-12-07 ENCOUNTER — Telehealth: Payer: Self-pay | Admitting: Emergency Medicine

## 2021-12-07 ENCOUNTER — Emergency Department
Admission: EM | Admit: 2021-12-07 | Discharge: 2021-12-07 | Disposition: A | Discharge status: Home / Self Care | Payer: Self-pay | Attending: Emergency Medicine | Admitting: Emergency Medicine

## 2021-12-07 DIAGNOSIS — A530 Latent syphilis, unspecified as early or late: Secondary | ICD-10-CM

## 2021-12-07 DIAGNOSIS — A539 Syphilis, unspecified: Secondary | ICD-10-CM | POA: Insufficient documentation

## 2021-12-07 DIAGNOSIS — N390 Urinary tract infection, site not specified: Secondary | ICD-10-CM

## 2021-12-07 DIAGNOSIS — Z113 Encounter for screening for infections with a predominantly sexual mode of transmission: Secondary | ICD-10-CM

## 2021-12-07 DIAGNOSIS — N760 Acute vaginitis: Secondary | ICD-10-CM

## 2021-12-07 DIAGNOSIS — A549 Gonococcal infection, unspecified: Secondary | ICD-10-CM

## 2021-12-07 LAB — CHLAMYDIA/NGC RT PCR (ARMC ONLY)
Chlamydia Tr: NOT DETECTED
N gonorrhoeae: DETECTED — AB

## 2021-12-07 LAB — POC URINE PREG, ED: Preg Test, Ur: NEGATIVE

## 2021-12-07 LAB — WET PREP, GENITAL
Sperm: NONE SEEN
Trich, Wet Prep: NONE SEEN
WBC, Wet Prep HPF POC: <10 (ref <10)
Yeast Wet Prep HPF POC: NONE SEEN

## 2021-12-07 LAB — URINALYSIS, ROUTINE W REFLEX MICROSCOPIC
Bilirubin Urine: NEGATIVE
Glucose, UA: NEGATIVE mg/dL
Ketones, ur: 20 mg/dL — AB
Nitrite: NEGATIVE
Protein, ur: 30 mg/dL — AB
Specific Gravity, Urine: 1.006 (ref 1.005–1.030)
WBC, UA: >50 WBC/hpf — ABNORMAL HIGH (ref 0–5)
pH: 6 (ref 5.0–8.0)

## 2021-12-07 LAB — RPR
RPR Ser Ql: REACTIVE — AB
RPR Titer: 1:32 {titer}

## 2021-12-07 LAB — HIV ANTIBODY (ROUTINE TESTING W REFLEX): HIV Screen 4th Generation wRfx: NONREACTIVE

## 2021-12-07 MED ORDER — PENICILLIN G BENZATHINE 1200000 UNIT/2ML IM SUSY
1.2000 10*6.[IU] | PREFILLED_SYRINGE | Freq: Once | INTRAMUSCULAR | Status: DC
  Filled 2021-12-07: qty 2

## 2021-12-07 MED ORDER — PENICILLIN G BENZATHINE 1200000 UNIT/2ML IM SUSY
2.4000 10*6.[IU] | PREFILLED_SYRINGE | Freq: Once | INTRAMUSCULAR | Status: AC
  Administered 2021-12-07: 2.4 10*6.[IU] via INTRAMUSCULAR
  Filled 2021-12-07: qty 4

## 2021-12-07 MED ORDER — METRONIDAZOLE 500 MG PO TABS
500.0000 mg | ORAL_TABLET | Freq: Once | ORAL | Status: AC
  Administered 2021-12-07: 500 mg via ORAL
  Filled 2021-12-07: qty 1

## 2021-12-07 MED ORDER — CEPHALEXIN 500 MG PO CAPS
500.0000 mg | ORAL_CAPSULE | Freq: Once | ORAL | Status: AC
  Administered 2021-12-07: 500 mg via ORAL
  Filled 2021-12-07: qty 1

## 2021-12-07 MED ORDER — CEFTRIAXONE SODIUM 1 G IJ SOLR
500.0000 mg | Freq: Once | INTRAMUSCULAR | Status: AC
  Administered 2021-12-07: 500 mg via INTRAMUSCULAR
  Filled 2021-12-07: qty 10

## 2021-12-07 MED ORDER — METRONIDAZOLE 500 MG PO TABS: 500.0000 mg | ORAL_TABLET | Freq: Two times a day (BID) | ORAL | 0 refills | Status: DC

## 2021-12-07 MED ORDER — CEPHALEXIN 500 MG PO CAPS: 500.0000 mg | ORAL_CAPSULE | Freq: Two times a day (BID) | ORAL | 0 refills | Status: DC

## 2021-12-07 NOTE — Telephone Encounter (Signed)
Called patient due to reactive rpr result.  No answer and no voicemail.   ?

## 2021-12-07 NOTE — Discharge Instructions (Signed)
You have been treated for syphilis with a single injection of antibiotic given in the ED.  You should avoid intimate contact with any and all partners for the next 7 days.  Continue the previously prescribed antibiotics as directed until all pills are gone.  You should follow-up with your primary provider or return to the ED if needed. ?

## 2021-12-07 NOTE — Discharge Instructions (Addendum)
You have tested positive for gonorrhea today.  You and your sexual partner will need to be treated and wait at least 1 week after treatment and both be asymptomatic before resuming sexual intercourse including oral, vaginal, anal intercourse. ? ?You have also tested positive for bacterial vaginosis.  This is not an STD but does require treatment and can cause discharge, odor and irritation. ? ? ?Your urine is also concerning for infection which could be due to your gonorrhea but we are covering you with antibiotics for possible UTI. ? ? ?The rest of your STD screening including herpes, HIV and syphilis are pending.  You may follow-up on these test results through MyChart. ?

## 2021-12-07 NOTE — ED Provider Notes (Signed)
? ? ?  [facility] ?Emergency Department Provider Note ? ? ? ? Event Date/Time  ? First MD Initiated Contact with Patient 12/07/21 1712   ?  (approximate) ? ? ?History  ? ?HPI ? ?Kimberly Powers is a 26 y.o. female presents to the ED after she was notified via telephone of her positive RPR test.  Patient presented to the ED initially last night for evaluation, and was treated empirically for chlamydia and had a positive gonorrhea test.  Patient returns today for syphilis treatment.  Her female partner is present at bedside. ? ? ?Physical Exam  ? ?Triage Vital Signs: ?ED Triage Vitals  ?Enc Vitals Group  ?   BP 12/07/21 1639 121/75  ?   Pulse Rate 12/07/21 1639 85  ?   Resp 12/07/21 1639 18  ?   Temp 12/07/21 1639 98 ?F (36.7 ?C)  ?   Temp src --   ?   SpO2 12/07/21 1639 100 %  ?   Weight --   ?   Height --   ?   Head Circumference --   ?   Peak Flow --   ?   Pain Score 12/07/21 1638 2  ?   Pain Loc --   ?   Pain Edu? --   ?   Excl. in [place]? --   ? ? ?Most recent vital signs: ?Vitals:  ? 12/07/21 1639  ?BP: 121/75  ?Pulse: 85  ?Resp: 18  ?Temp: 98 ?F (36.7 ?C)  ?SpO2: 100%  ? ? ?General Awake, no distress.  ?CV:  Good peripheral perfusion.  ?RESP:  Normal effort.  ?ABD:  No distention.  ? ? ?ED Results / Procedures / Treatments  ? ?Labs ?(all labs ordered are listed, but only abnormal results are displayed) ?Labs Reviewed - No data to display ? ? ?EKG ? ? ? ?RADIOLOGY ? ? ?No results found. ? ? ?PROCEDURES: ? ?Critical Care performed: No ? ?Procedures ? ? ?MEDICATIONS ORDERED IN ED: ?Medications  ?penicillin g benzathine (BICILLIN LA) 1200000 UNIT/2ML injection 2.4 Million Units (2.4 Million Units Intramuscular Given 12/07/21 1735)  ? ? ? ?IMPRESSION / MDM / ASSESSMENT AND PLAN / ED COURSE  ?I reviewed the triage vital signs and the nursing notes. ?             ?               ? ?Patient to the ED for STD treatment for positive RPR blood test.  Patient presents in no acute distress after being  evaluated initially last night.  She continues to take previously prescribed antibiotics as directed.  She denies any interim complaints.  Patient's diagnosis is consistent with syphilis. Patient is to follow up with the health department or her primary provider as needed or otherwise directed. Patient is given ED precautions to return to the ED for any worsening or new symptoms. ? ? ?  ? ? ?FINAL CLINICAL IMPRESSION(S) / ED DIAGNOSES  ? ?Final diagnoses:  ?Positive RPR test  ?Syphilis, unspecified  ? ? ? ?Rx / DC Orders  ? ?ED Discharge Orders   ? ? None  ? ?  ? ? ? ?Note:  This document was prepared using Dragon voice recognition software and may include unintentional dictation errors. ? ?  ?Melvenia Needles, PA-C ?12/07/21 1813 ? ?  ?Harvest Dark, MD ?12/07/21 2125 ? ?
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
## 2021-12-07 NOTE — ED Triage Notes (Signed)
Pt comes with needing treatment for her STD. Pt states her PCP couldn't get her in for a week. Pt states she was then advised to come here for treatment. ?

## 2021-12-07 NOTE — Telephone Encounter (Signed)
Contacted patient.  Expained reactive rpr.  She denies ever having syphilis in the past.  Advised her to call [facility] or pcp and that she needs treatment.  I gave her [facility] phone number and she agrees to call now. ?

## 2021-12-07 NOTE — Telephone Encounter (Signed)
Patient went to the ER yesterday, and now received a call from the hospital saying that she tested positive for syphilis and referring her to [place] for treatment. She would like to be seen asap. Ms. Kimberly Powers told me to put this phone note so that someone from the clinic call the patient and assess what she needs. Thanks ?

## 2021-12-08 ENCOUNTER — Telehealth: Payer: Self-pay

## 2021-12-08 LAB — URINE CULTURE: Culture: NO GROWTH

## 2021-12-08 LAB — T.PALLIDUM AB, TOTAL: T Pallidum Abs: REACTIVE — AB

## 2021-12-08 NOTE — Telephone Encounter (Addendum)
Kimberly Snipes, FNP reviewed labs and documents/pics from 12/07/21 ED visit, and reviewed historical information documented (including bicillin tx in 7/22).  Per verbal order by Kimberly Snipes, FNP, pt to be instructed that no additional tx needed at this time due to bicillin admininstered 12/07/21, RTC for follow-up in 6 months, discuss services and tx provided by [facility], discuss tx received in 7/22 and the importance of sharing hx/medical info with providers in the future. Pt instructed to abstain from sex for 14 days and partners need to be evaluated and treated properly before engaging in any sexual activity. ?Also discuss DIS and to expect a call from them. ?

## 2021-12-08 NOTE — Telephone Encounter (Signed)
Phone call to pt at [phone]. Unable to leave message due to voicemail not set up. ? ?[employer]. ? ? ?

## 2021-12-08 NOTE — Telephone Encounter (Signed)
Phone call received from pt. Pt confirmed identity. ?Counseled pt per provider orders and documentation in 1st entry below. ?

## 2021-12-09 LAB — HSV CULTURE AND TYPING

## 2021-12-12 ENCOUNTER — Telehealth: Payer: Self-pay | Admitting: Family Medicine

## 2021-12-12 NOTE — Telephone Encounter (Signed)
Pt calls & states she needs treatment for Syphilis & Gonorrhea; states she was treated a week ago but re-exposed & symptoms have returned. Please advise. ?

## 2021-12-13 ENCOUNTER — Ambulatory Visit: Payer: Self-pay

## 2021-12-13 ENCOUNTER — Telehealth: Payer: Self-pay

## 2021-12-13 NOTE — Telephone Encounter (Signed)
No additional notes

## 2021-12-14 ENCOUNTER — Encounter: Payer: Self-pay | Admitting: Nurse Practitioner

## 2021-12-14 ENCOUNTER — Ambulatory Visit: Payer: Self-pay | Admitting: Nurse Practitioner

## 2021-12-14 DIAGNOSIS — F32A Depression, unspecified: Secondary | ICD-10-CM

## 2021-12-14 DIAGNOSIS — Z113 Encounter for screening for infections with a predominantly sexual mode of transmission: Secondary | ICD-10-CM

## 2021-12-14 DIAGNOSIS — A549 Gonococcal infection, unspecified: Secondary | ICD-10-CM

## 2021-12-14 DIAGNOSIS — F419 Anxiety disorder, unspecified: Secondary | ICD-10-CM

## 2021-12-14 DIAGNOSIS — A539 Syphilis, unspecified: Secondary | ICD-10-CM

## 2021-12-14 MED ORDER — PENICILLIN G BENZATHINE 1200000 UNIT/2ML IM SUSY: 2.4000 10*6.[IU] | PREFILLED_SYRINGE | INTRAMUSCULAR | Status: DC

## 2021-12-14 MED ORDER — CEFTRIAXONE SODIUM 500 MG IJ SOLR
500.0000 mg | Freq: Once | INTRAMUSCULAR | Status: AC
  Administered 2021-12-14: 500 mg via INTRAMUSCULAR

## 2021-12-14 MED ORDER — PENICILLIN G BENZATHINE 1200000 UNIT/2ML IM SUSY
2.4000 10*6.[IU] | PREFILLED_SYRINGE | Freq: Once | INTRAMUSCULAR | Status: AC
  Administered 2021-12-14: 2.4 10*6.[IU] via INTRAMUSCULAR

## 2021-12-14 NOTE — Progress Notes (Signed)
[facility] Department ? ?STI clinic/screening visit ?[place] [address] ?[phone] ? ?Subjective:  ?Kimberly Powers is a 26 y.o. female being seen today for an STI screening visit. The patient reports they do not have symptoms.  Patient reports that they do not desire a pregnancy in the next year.   They reported they are not interested in discussing contraception today.   ? ?Patient's last menstrual period was 12/06/2021 (exact date). ? ? ?Patient has the following medical conditions:   ?Patient Active Problem List  ? Diagnosis Date Noted  ? Nausea and vomiting in adult 06/05/2019  ? Pregnancy 12/13/2016  ? Irregular uterine contractions 12/12/2016  ? Indication for care in labor or delivery 12/11/2016  ? Labor and delivery indication for care or intervention 07/21/2016  ? First trimester screening   ? ? ?Chief Complaint  ?Patient presents with  ? SEXUALLY TRANSMITTED DISEASE  ?  Screening  ? ? ?HPI ? ?Patient reports to clinic today for treatment.  Patient reported to clinic on 12/07/21 with complaints of vaginal itching and irritation for the past 2 -3 weeks and ulcerated lesions to her upper and lower lips. She was diagnosed with Syphillis and Gonorrhea.  Patient received treatment on 12/07/21, but stated had sex prior to partner being treatment.   ? ?Last HIV test per patient/review of record was 12/07/2021 ?Patient reports last pap was: Patient is unsure  ? ?Screening for MPX risk: ?Does the patient have an unexplained rash? No ?Is the patient MSM? No ?Does the patient endorse multiple sex partners or anonymous sex partners? No ?Did the patient have close or sexual contact with a person diagnosed with MPX? No ?Has the patient traveled outside the [place] where MPX is endemic? No ?Is there a high clinical suspicion for MPX-- evidenced by one of the following No ? -Unlikely to be chickenpox ? -Lymphadenopathy ? -Rash that present in same phase of evolution on any given body  part ?See flowsheet for further details and programmatic requirements.  ? ? ?The following portions of the patient's history were reviewed and updated as appropriate: allergies, current medications, past medical history, past social history, past surgical history and problem list. ? ?Objective:  ?There were no vitals filed for this visit. ? ?Physical Exam ?Constitutional:   ?   Appearance: Normal appearance.  ?HENT:  ?   Head: Normocephalic.  ?   Right Ear: External ear normal.  ?   Left Ear: External ear normal.  ?   Nose: Nose normal.  ?   Mouth/Throat:  ?   Lips: Pink.  ?   Mouth: Mucous membranes are moist.  ?   Comments: No visible signs of dental caries. Inner bottom and upper lip healing.  No ulceration noted.  ?Pulmonary:  ?   Effort: Pulmonary effort is normal.  ?Genitourinary: ?   Comments: Deferred no genital exam performed.   ?Musculoskeletal:  ?   Cervical back: Full passive range of motion without pain and normal range of motion.  ?Skin: ?   General: Skin is warm and dry.  ?Neurological:  ?   Mental Status: She is alert and oriented to person, place, and time.  ?Psychiatric:     ?   Attention and Perception: Attention normal.     ?   Mood and Affect: Mood normal.     ?   Speech: Speech normal.     ?   Behavior: Behavior is cooperative.  ? ? ? ?  Assessment and Plan:  ?Kimberly Powers is a 26 y.o. female presenting to the [facility] Department for STI screening ? ?1. Screening examination for venereal disease ?-26 year old female in clinic today for treatment. ?-No STD screenings performed today.   ? ?2. Depression, unspecified depression type ?-Patient reports history of depression and anxiety.  Referral submitted to [facility] counseling.   ?- Ambulatory referral to [place] ? ?3. Anxiety ?-Patient reports history of depression and anxiety.  Referral submitted to [facility] counseling.   ?- Ambulatory referral to [place] ? ?4. Syphilis ?-Please retreat patient today for Syphillis.  Advised patient not to have sex for 14 days and 14 days after partner is treated.  Advised to use condoms with all sex.   ?- penicillin g benzathine (BICILLIN LA) 1200000 UNIT/2ML injection 2.4 Million Units ? ?5. Gonorrhea ?-Please retreat patient today for Gonorrhea. Advised patient not to have sex for 14 days and 14 days after partner is treated.  Advised to use condoms with all sex.   ?- cefTRIAXone (ROCEPHIN) injection 500 mg ? ? ? ? ?Return if symptoms worsen or fail to improve.   ? ? ? ?Gregary Cromer, FNP ? ?

## 2021-12-14 NOTE — Progress Notes (Signed)
Pt here for treatment of Syphilis and Gonorrhea.  Ceftriaxone 500 mg and Bicillin 2.4 MU given IM without any complications  Condoms given.  Berdie Ogren, RN ? ?

## 2022-01-05 ENCOUNTER — Ambulatory Visit: Payer: Self-pay | Admitting: Licensed Clinical Social Worker

## 2022-02-09 ENCOUNTER — Emergency Department
Admission: EM | Admit: 2022-02-09 | Discharge: 2022-02-09 | Disposition: A | Discharge status: Home / Self Care | Payer: Self-pay | Attending: Emergency Medicine | Admitting: Emergency Medicine

## 2022-02-09 ENCOUNTER — Other Ambulatory Visit: Payer: Self-pay

## 2022-02-09 ENCOUNTER — Encounter: Payer: Self-pay | Admitting: *Deleted

## 2022-02-09 DIAGNOSIS — K1379 Other lesions of oral mucosa: Secondary | ICD-10-CM | POA: Insufficient documentation

## 2022-02-09 DIAGNOSIS — Z5321 Procedure and treatment not carried out due to patient leaving prior to being seen by health care provider: Secondary | ICD-10-CM | POA: Insufficient documentation

## 2022-02-09 NOTE — ED Triage Notes (Addendum)
Pt reports mouth sores for 3 days.  No otc meds  hx syphilis  pt alert

## 2022-02-09 NOTE — ED Notes (Signed)
No answer when called several times from lobby 

## 2022-02-09 NOTE — ED Notes (Addendum)
No answer when called several times from lobby; no answer when phone # listed in chart called 

## 2022-02-09 NOTE — ED Triage Notes (Signed)
Pt comes with c/o 3 days of sore in mouth. Pt states she had this happen in past with syphilis. Pt states pain when swallowing and eating.

## 2022-02-09 NOTE — ED Provider Triage Note (Signed)
Emergency Medicine Provider Triage Evaluation Note  Kimberly Powers , a 26 y.o. female  was evaluated in triage.  Pt complains of mouth sores and lesions with recent syphilis.  She received treatment here but is not sure if her partner was treated.  Mouth sores are worse this time.  She denies skin lesions..   Physical Exam  BP 113/83 (BP Location: Left Arm)   Pulse 84   Temp 99.6 F (37.6 C) (Oral)   Resp 18   Ht 5\' 3"  (1.6 m)   Wt 52.6 kg   LMP 01/26/2022 (Approximate)   SpO2 100%   BMI 20.55 kg/m  Gen:   Awake, no distress   Resp:  Normal effort  MSK:   Moves extremities without difficulty  Other:    Medical Decision Making  Medically screening exam initiated at 5:48 PM.  Appropriate orders placed.  01/28/2022 was informed that the remainder of the evaluation will be completed by another provider, this initial triage assessment does not replace that evaluation, and the importance of remaining in the ED until their evaluation is complete.  RPR ordered.   Leland Callas, FNP 02/09/22 1749

## 2022-02-10 LAB — RPR
RPR Ser Ql: REACTIVE — AB
RPR Titer: 1:8 {titer}

## 2022-02-10 LAB — T.PALLIDUM AB, TOTAL: T Pallidum Abs: REACTIVE — AB

## 2022-06-05 ENCOUNTER — Telehealth: Payer: Self-pay

## 2022-06-05 NOTE — Telephone Encounter (Signed)
Calling pt re e-mail received from Timothy Lasso, RN CD Nursing Supervisor:  12/07/2021 1:32, treated at [facility], and then retreated May 2023  02/08/2022 1:8 05/11/2022 1:2  Princella Ion told her that she needs to be treated, I spoke with Doren Custard, she doesn't need to be treated unless she has been re-exposed or has symptoms.  Do you mind calling her to counsel her and cancel her appt if she doesn't need it?

## 2022-06-05 NOTE — Telephone Encounter (Signed)
Phone call to pt at [phone]. Phone rang many times and then to busy signal. Unable to leave message. Tried twice. Phone call to pt contact listed in chart, [phone]. Left message on voicemail that dr's office is trying to get in touch with pt and this number listed as alternative number. Left call back number [phone].

## 2022-06-06 ENCOUNTER — Ambulatory Visit: Payer: Self-pay | Admitting: Nurse Practitioner

## 2022-06-06 DIAGNOSIS — A539 Syphilis, unspecified: Secondary | ICD-10-CM

## 2022-06-06 NOTE — Progress Notes (Signed)
Patient reports to clinic today as a referral from Princella Ion for Syphilis treatment.  Patient has a history of Syphilis and was treated in 11/2021 and in 12/2021.  Patient reports being screened in 04/2022 for all STDs and was notified of a positive RPR results.  Patient was informed to report to [facility] for treatment.  After assessment of lab work there has been a significant decline in her RPR titers.  Titer results are as follows:  12/07/2021 1:32, treated at [facility], and then retreated May 2023  02/08/2022 1:8 05/11/2022 1:2  Since titers are declining and no known recent exposures, no treatment is needed at this time.  Advised patient to follow up in 6 months for rescreen.  Gregary Cromer, FNP

## 2022-06-06 NOTE — Progress Notes (Signed)
Pt here for possible retreatment of syphilis.  Sent here by Princella Ion.  No treatment needed at this time per provider Gregary Cromer, FNP.  Condoms provided.-Forest Becker, RN

## 2022-10-23 ENCOUNTER — Ambulatory Visit: Payer: Self-pay | Admitting: Family Medicine

## 2022-11-30 ENCOUNTER — Ambulatory Visit: Payer: Self-pay

## 2022-12-10 ENCOUNTER — Emergency Department
Admission: EM | Admit: 2022-12-10 | Discharge: 2022-12-10 | Disposition: A | Discharge status: Home / Self Care | Payer: Self-pay | Attending: Emergency Medicine | Admitting: Emergency Medicine

## 2022-12-10 ENCOUNTER — Other Ambulatory Visit: Payer: Self-pay

## 2022-12-10 DIAGNOSIS — Z202 Contact with and (suspected) exposure to infections with a predominantly sexual mode of transmission: Secondary | ICD-10-CM | POA: Insufficient documentation

## 2022-12-10 DIAGNOSIS — N898 Other specified noninflammatory disorders of vagina: Secondary | ICD-10-CM

## 2022-12-10 LAB — URINALYSIS, ROUTINE W REFLEX MICROSCOPIC
Bacteria, UA: NONE SEEN
Bilirubin Urine: NEGATIVE
Glucose, UA: NEGATIVE mg/dL
Hgb urine dipstick: NEGATIVE
Ketones, ur: NEGATIVE mg/dL
Nitrite: NEGATIVE
Protein, ur: NEGATIVE mg/dL
Specific Gravity, Urine: 1.024 (ref 1.005–1.030)
pH: 6 (ref 5.0–8.0)

## 2022-12-10 LAB — POC URINE PREG, ED: Preg Test, Ur: NEGATIVE

## 2022-12-10 MED ORDER — LIDOCAINE HCL (PF) 1 % IJ SOLN
1.2000 mL | Freq: Once | INTRAMUSCULAR | Status: AC
  Administered 2022-12-10: 1.2 mL
  Filled 2022-12-10: qty 5

## 2022-12-10 MED ORDER — ONDANSETRON 4 MG PO TBDP
4.0000 mg | ORAL_TABLET | Freq: Once | ORAL | Status: AC
  Administered 2022-12-10: 4 mg via ORAL
  Filled 2022-12-10: qty 1

## 2022-12-10 MED ORDER — METRONIDAZOLE 500 MG PO TABS: 500.0000 mg | ORAL_TABLET | Freq: Three times a day (TID) | ORAL | 0 refills | Status: AC

## 2022-12-10 MED ORDER — AZITHROMYCIN 500 MG PO TABS
1000.0000 mg | ORAL_TABLET | Freq: Once | ORAL | Status: AC
  Administered 2022-12-10: 1000 mg via ORAL
  Filled 2022-12-10: qty 2

## 2022-12-10 MED ORDER — CEFTRIAXONE SODIUM 1 G IJ SOLR
500.0000 mg | Freq: Once | INTRAMUSCULAR | Status: AC
  Administered 2022-12-10: 500 mg via INTRAMUSCULAR
  Filled 2022-12-10: qty 10

## 2022-12-10 MED ORDER — FLUCONAZOLE 150 MG PO TABS: 150.0000 mg | ORAL_TABLET | Freq: Every day | ORAL | 0 refills | Status: DC

## 2022-12-10 NOTE — ED Provider Notes (Signed)
[facility] Provider Note    Event Date/Time   First MD Initiated Contact with Patient 12/10/22 1200     (approximate)   History   SEXUALLY TRANSMITTED DISEASE   HPI  Kimberly Powers is a 27 y.o. female with history of depression, PTSD, pregnancy and as listed in EMR presents to the emergency department for treatment and evaluation for STI testing.  She reports that her boyfriend told her that he had tested positive for a few things and she was unable to get into see her PCP.  She is experiencing vaginal discharge and odor and mild itching.Marland Kitchen      Physical Exam   Triage Vital Signs: ED Triage Vitals [12/10/22 1145]  Enc Vitals Group     BP 109/81     Pulse Rate 83     Resp 16     Temp 98.1 F (36.7 C)     Temp Source Oral     SpO2 100 %     Weight 115 lb 15.4 oz (52.6 kg)     Height 5\' 3"  (1.6 m)     Head Circumference      Peak Flow      Pain Score 8     Pain Loc      Pain Edu?      Excl. in GC?     Most recent vital signs: Vitals:   12/10/22 1145  BP: 109/81  Pulse: 83  Resp: 16  Temp: 98.1 F (36.7 C)  SpO2: 100%    General: Awake, no distress.  CV:  Good peripheral perfusion.  Resp:  Normal effort.  Abd:  No distention.  Other:  Vaginal exam deferred.   ED Results / Procedures / Treatments   Labs (all labs ordered are listed, but only abnormal results are displayed) Labs Reviewed  URINALYSIS, ROUTINE W REFLEX MICROSCOPIC - Abnormal; Notable for the following components:      Result Value   Color, Urine YELLOW (*)    APPearance HAZY (*)    Leukocytes,Ua TRACE (*)    All other components within normal limits  CHLAMYDIA/NGC RT PCR ([facility] ONLY)            WET PREP, GENITAL  POC URINE PREG, ED     EKG     RADIOLOGY   PROCEDURES:  Critical Care performed: No  Procedures   MEDICATIONS ORDERED IN ED:  Medications  cefTRIAXone (ROCEPHIN) injection 500 mg (500 mg Intramuscular Given 12/10/22 1232)   lidocaine (PF) (XYLOCAINE) 1 % injection 1.2 mL (1.2 mLs Other Given 12/10/22 1233)  azithromycin (ZITHROMAX) tablet 1,000 mg (1,000 mg Oral Given 12/10/22 1232)     IMPRESSION / MDM / ASSESSMENT AND PLAN / ED COURSE   I have reviewed the triage note.  Differential diagnosis includes, but is not limited to, chlamydia, gonorrhea, trichomonas, bacterial vaginosis, PID  Patient's presentation is most consistent with acute illness / injury with system symptoms.  27 year old female presenting to the emergency department for treatment and evaluation of known exposure to STI including trichomonas and chlamydia.  Plan will be to treat her empirically as she is also having malodorous vaginal discharge.  Rocephin injection and 1 g of azithromycin administered while here in the emergency department.  She will be treated with outpatient Flagyl and Diflucan as well.  Patient encouraged to avoid intercourse for the next week or until completely symptom-free.  She was encouraged to follow-up with the [facility] department or her  primary care provider for any concerns.      FINAL CLINICAL IMPRESSION(S) / ED DIAGNOSES   Final diagnoses:  Exposure to STD  Vaginal itching     Rx / DC Orders   ED Discharge Orders          Ordered    metroNIDAZOLE (FLAGYL) 500 MG tablet  3 times daily        12/10/22 1230    fluconazole (DIFLUCAN) 150 MG tablet  Daily        12/10/22 1230             Note:  This document was prepared using Dragon voice recognition software and may include unintentional dictation errors.   Chinita Pester, FNP 12/10/22 1235    Minna Antis, MD 12/10/22 1844

## 2022-12-10 NOTE — ED Triage Notes (Signed)
Pt here for a STI testing. Pt states her bf told her that he tested positive for a few things and she was unable to get in to see her doctor. Pt is having an discharge and odor and mild itching.

## 2022-12-10 NOTE — Discharge Instructions (Signed)
Please refrain from intercourse for at least 1 week or until you are symptom free.  See primary care or the STI clinic if not improving over the week.

## 2023-05-21 ENCOUNTER — Emergency Department
Admission: EM | Admit: 2023-05-21 | Discharge: 2023-05-22 | Disposition: A | Discharge status: Home / Self Care | Payer: Medicaid Other | Attending: Emergency Medicine | Admitting: Emergency Medicine

## 2023-05-21 ENCOUNTER — Encounter: Payer: Self-pay | Admitting: Emergency Medicine

## 2023-05-21 ENCOUNTER — Other Ambulatory Visit: Payer: Self-pay

## 2023-05-21 ENCOUNTER — Emergency Department: Payer: Medicaid Other

## 2023-05-21 DIAGNOSIS — O26899 Other specified pregnancy related conditions, unspecified trimester: Secondary | ICD-10-CM

## 2023-05-21 DIAGNOSIS — Z3A01 Less than 8 weeks gestation of pregnancy: Secondary | ICD-10-CM | POA: Insufficient documentation

## 2023-05-21 DIAGNOSIS — R102 Pelvic and perineal pain: Secondary | ICD-10-CM | POA: Diagnosis not present

## 2023-05-21 DIAGNOSIS — E860 Dehydration: Secondary | ICD-10-CM | POA: Insufficient documentation

## 2023-05-21 DIAGNOSIS — O26891 Other specified pregnancy related conditions, first trimester: Secondary | ICD-10-CM | POA: Diagnosis not present

## 2023-05-21 DIAGNOSIS — O219 Vomiting of pregnancy, unspecified: Secondary | ICD-10-CM | POA: Diagnosis present

## 2023-05-21 LAB — CBC WITH DIFFERENTIAL/PLATELET
Abs Immature Granulocytes: 0.02 10*3/uL (ref 0.00–0.07)
Basophils Absolute: 0 10*3/uL (ref 0.0–0.1)
Basophils Relative: 0 %
Eosinophils Absolute: 0 10*3/uL (ref 0.0–0.5)
Eosinophils Relative: 1 %
HCT: 34.7 % — ABNORMAL LOW (ref 36.0–46.0)
Hemoglobin: 12 g/dL (ref 12.0–15.0)
Immature Granulocytes: 0 %
Lymphocytes Relative: 40 %
Lymphs Abs: 2.5 10*3/uL (ref 0.7–4.0)
MCH: 33.2 pg (ref 26.0–34.0)
MCHC: 34.6 g/dL (ref 30.0–36.0)
MCV: 96.1 fL (ref 80.0–100.0)
Monocytes Absolute: 0.6 10*3/uL (ref 0.1–1.0)
Monocytes Relative: 9 %
Neutro Abs: 3.1 10*3/uL (ref 1.7–7.7)
Neutrophils Relative %: 50 %
Platelets: 161 10*3/uL (ref 150–400)
RBC: 3.61 MIL/uL — ABNORMAL LOW (ref 3.87–5.11)
RDW: 11 % — ABNORMAL LOW (ref 11.5–15.5)
WBC: 6.2 10*3/uL (ref 4.0–10.5)
nRBC: 0 % (ref 0.0–0.2)

## 2023-05-21 LAB — COMPREHENSIVE METABOLIC PANEL
ALT: 15 U/L (ref 0–44)
AST: 20 U/L (ref 15–41)
Albumin: 4 g/dL (ref 3.5–5.0)
Alkaline Phosphatase: 38 U/L (ref 38–126)
Anion gap: 7 (ref 5–15)
BUN: 14 mg/dL (ref 6–20)
CO2: 21 mmol/L — ABNORMAL LOW (ref 22–32)
Calcium: 9 mg/dL (ref 8.9–10.3)
Chloride: 106 mmol/L (ref 98–111)
Creatinine, Ser: 0.63 mg/dL (ref 0.44–1.00)
GFR, Estimated: >60 mL/min (ref >60)
Glucose, Bld: 78 mg/dL (ref 70–99)
Potassium: 3.6 mmol/L (ref 3.5–5.1)
Sodium: 134 mmol/L — ABNORMAL LOW (ref 135–145)
Total Bilirubin: 0.8 mg/dL (ref 0.3–1.2)
Total Protein: 7 g/dL (ref 6.5–8.1)

## 2023-05-21 LAB — URINALYSIS, ROUTINE W REFLEX MICROSCOPIC
Bilirubin Urine: NEGATIVE
Glucose, UA: NEGATIVE mg/dL
Hgb urine dipstick: NEGATIVE
Ketones, ur: 80 mg/dL — AB
Nitrite: NEGATIVE
Protein, ur: NEGATIVE mg/dL
Specific Gravity, Urine: 1.031 — ABNORMAL HIGH (ref 1.005–1.030)
pH: 5 (ref 5.0–8.0)

## 2023-05-21 LAB — POC URINE PREG, ED: Preg Test, Ur: POSITIVE — AB

## 2023-05-21 LAB — HCG, QUANTITATIVE, PREGNANCY: hCG, Beta Chain, Quant, S: 100373 m[IU]/mL — ABNORMAL HIGH (ref <5)

## 2023-05-21 NOTE — ED Triage Notes (Signed)
Pt in with c/o low center abdominal cramping that radiates to back. Pt states she is about [redacted] wks pregnant, denies any vaginal discharge or bleeding.
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
## 2023-05-22 MED ORDER — ONDANSETRON HCL 4 MG/2ML IJ SOLN
4.0000 mg | Freq: Once | INTRAMUSCULAR | Status: AC
  Administered 2023-05-22: 4 mg via INTRAVENOUS
  Filled 2023-05-22: qty 2

## 2023-05-22 MED ORDER — ONDANSETRON 4 MG PO TBDP: 4.0000 mg | ORAL_TABLET | Freq: Four times a day (QID) | ORAL | 0 refills | Status: DC | PRN

## 2023-05-22 MED ORDER — SODIUM CHLORIDE 0.9 % IV BOLUS (SEPSIS)
1000.0000 mL | Freq: Once | INTRAVENOUS | Status: AC
  Administered 2023-05-22: 1000 mL via INTRAVENOUS

## 2023-05-22 MED ORDER — ACETAMINOPHEN 500 MG PO TABS
1000.0000 mg | ORAL_TABLET | Freq: Once | ORAL | Status: AC
  Administered 2023-05-22: 1000 mg via ORAL
  Filled 2023-05-22: qty 2

## 2023-05-22 NOTE — Discharge Instructions (Addendum)
You may take Tylenol 1000 mg every 6 hours as needed for pain.  Please make sure to increase your fluid intake as you need more fluids when you are pregnant.  Please take your prenatal vitamins and follow-up with your OB/GYN as scheduled on November 5.  Your ultrasound showed a fetus with normal heart rate measuring about 6 weeks with an estimated due date of 01/14/2024.

## 2023-06-19 LAB — OB RESULTS CONSOLE VARICELLA ZOSTER ANTIBODY, IGG: Varicella: IMMUNE

## 2023-06-19 LAB — OB RESULTS CONSOLE HIV ANTIBODY (ROUTINE TESTING): HIV: NONREACTIVE

## 2023-06-19 LAB — OB RESULTS CONSOLE RPR: RPR: NONREACTIVE

## 2023-06-19 LAB — OB RESULTS CONSOLE RUBELLA ANTIBODY, IGM: Rubella: IMMUNE

## 2023-06-19 LAB — OB RESULTS CONSOLE HEPATITIS B SURFACE ANTIGEN: Hepatitis B Surface Ag: NEGATIVE

## 2023-07-21 ENCOUNTER — Other Ambulatory Visit: Payer: Self-pay

## 2023-07-21 ENCOUNTER — Emergency Department
Admission: EM | Admit: 2023-07-21 | Discharge: 2023-07-21 | Disposition: A | Discharge status: Home / Self Care | Payer: Medicaid Other | Attending: Emergency Medicine | Admitting: Emergency Medicine

## 2023-07-21 ENCOUNTER — Emergency Department: Payer: Medicaid Other

## 2023-07-21 DIAGNOSIS — O26852 Spotting complicating pregnancy, second trimester: Secondary | ICD-10-CM | POA: Insufficient documentation

## 2023-07-21 DIAGNOSIS — Z3A15 15 weeks gestation of pregnancy: Secondary | ICD-10-CM | POA: Insufficient documentation

## 2023-07-21 DIAGNOSIS — O4692 Antepartum hemorrhage, unspecified, second trimester: Secondary | ICD-10-CM

## 2023-07-21 LAB — URINALYSIS, W/ REFLEX TO CULTURE (INFECTION SUSPECTED)
Bacteria, UA: NONE SEEN
Bilirubin Urine: NEGATIVE
Glucose, UA: NEGATIVE mg/dL
Hgb urine dipstick: NEGATIVE
Ketones, ur: NEGATIVE mg/dL
Nitrite: NEGATIVE
Protein, ur: NEGATIVE mg/dL
Specific Gravity, Urine: 1.025 (ref 1.005–1.030)
pH: 6 (ref 5.0–8.0)

## 2023-07-21 LAB — CBC
HCT: 32.7 % — ABNORMAL LOW (ref 36.0–46.0)
Hemoglobin: 11.2 g/dL — ABNORMAL LOW (ref 12.0–15.0)
MCH: 32.9 pg (ref 26.0–34.0)
MCHC: 34.3 g/dL (ref 30.0–36.0)
MCV: 96.2 fL (ref 80.0–100.0)
Platelets: 153 10*3/uL (ref 150–400)
RBC: 3.4 MIL/uL — ABNORMAL LOW (ref 3.87–5.11)
RDW: 12.2 % (ref 11.5–15.5)
WBC: 5.8 10*3/uL (ref 4.0–10.5)
nRBC: 0 % (ref 0.0–0.2)

## 2023-07-21 LAB — BASIC METABOLIC PANEL
Anion gap: 9 (ref 5–15)
BUN: 9 mg/dL (ref 6–20)
CO2: 21 mmol/L — ABNORMAL LOW (ref 22–32)
Calcium: 9 mg/dL (ref 8.9–10.3)
Chloride: 104 mmol/L (ref 98–111)
Creatinine, Ser: 0.56 mg/dL (ref 0.44–1.00)
GFR, Estimated: >60 mL/min (ref >60)
Glucose, Bld: 102 mg/dL — ABNORMAL HIGH (ref 70–99)
Potassium: 3.7 mmol/L (ref 3.5–5.1)
Sodium: 134 mmol/L — ABNORMAL LOW (ref 135–145)

## 2023-07-21 LAB — HCG, QUANTITATIVE, PREGNANCY: hCG, Beta Chain, Quant, S: 26101 m[IU]/mL — ABNORMAL HIGH (ref <5)

## 2023-07-21 MED ORDER — METOCLOPRAMIDE HCL 10 MG PO TABS: 10.0000 mg | ORAL_TABLET | Freq: Four times a day (QID) | ORAL | 0 refills | Status: DC | PRN

## 2023-07-21 NOTE — Discharge Instructions (Signed)
Your ultrasound and lab tests today were all reassuring. Please follow up with your doctor for further evaluation of your symptoms. Ultrasound summary: IMPRESSION:  1. Single live intrauterine pregnancy with gestational age by  ultrasound of 14 weeks and 5 days which is concordant with  gestational age by last menstrual period of 15 weeks and 1 day.  2. Margin previa in the setting of a posterior placenta. Recommend  follow-up on 18-20 weeks anatomy ultrasound.

## 2023-07-21 NOTE — ED Provider Notes (Signed)
[facility] Provider Note    Event Date/Time   First MD Initiated Contact with Patient 07/21/23 1557     (approximate)   History   Chief Complaint: Vaginal Bleeding   HPI  Kimberly Powers is a 27 y.o. female currently [redacted] weeks pregnant by LMP who comes ED complaining of vaginal bleeding that started today.  When getting up this morning noticed a rush of blood, which then rapidly slowed down to a small trickle.  Does report some lower abdominal cramping earlier today as well as some pressure and urinary frequency.  Denies leakage of fluid, no contraction pain, no fever chills chest pain.  Taking prenatal vitamins.  Following up with [name] for prenatal care.  Patient notes intermittent headaches and nausea and vomiting during pregnancy which is a chronic issue for her, managed with Zofran.        Physical Exam   Triage Vital Signs: ED Triage Vitals  Encounter Vitals Group     BP 07/21/23 1417 116/73     Systolic BP Percentile --      Diastolic BP Percentile --      Pulse Rate 07/21/23 1417 74     Resp 07/21/23 1417 16     Temp 07/21/23 1417 97.8 F (36.6 C)     Temp Source 07/21/23 1635 Oral     SpO2 07/21/23 1417 100 %     Weight 07/21/23 1417 123 lb (55.8 kg)     Height 07/21/23 1417 5\' 3"  (1.6 m)     Head Circumference --      Peak Flow --      Pain Score 07/21/23 1417 8     Pain Loc --      Pain Education --      Exclude from Growth Chart --     Most recent vital signs: Vitals:   07/21/23 1417 07/21/23 1635  BP: 116/73 104/60  Pulse: 74 71  Resp: 16 17  Temp: 97.8 F (36.6 C) 97.6 F (36.4 C)  SpO2: 100% 99%    General: Awake, no distress.  CV:  Good peripheral perfusion.  Regular rate rhythm Resp:  Normal effort.  Clear to auscultation bilaterally Abd:  No distention.  Soft nontender Other:  Sterile mucosa.  Normal mental status   ED Results / Procedures / Treatments   Labs (all labs ordered are listed, but  only abnormal results are displayed) Labs Reviewed  CBC - Abnormal; Notable for the following components:      Result Value   RBC 3.40 (*)    Hemoglobin 11.2 (*)    HCT 32.7 (*)    All other components within normal limits  BASIC METABOLIC PANEL - Abnormal; Notable for the following components:   Sodium 134 (*)    CO2 21 (*)    Glucose, Bld 102 (*)    All other components within normal limits  HCG, QUANTITATIVE, PREGNANCY - Abnormal; Notable for the following components:   hCG, Beta Chain, Quant, S 26,101 (*)    All other components within normal limits  URINALYSIS, W/ REFLEX TO CULTURE (INFECTION SUSPECTED)     EKG    RADIOLOGY Ultrasound OB interpreted by me, shows live IUP, size consistent with dates, low-lying placenta   PROCEDURES:  Procedures   MEDICATIONS ORDERED IN ED: Medications - No data to display   IMPRESSION / MDM / ASSESSMENT AND PLAN / ED COURSE  I reviewed the triage vital signs and the nursing notes.  DDx: Subchorionic hemorrhage, spontaneous abortion, UTI, electrolyte abnormality, placenta previa  Patient's presentation is most consistent with acute presentation with potential threat to life or bodily function.  Patient presents with vaginal bleeding in second trimester pregnancy.  Will obtain pelvic ultrasound.   Clinical Course as of 07/21/23 1636  Sat Jul 21, 2023  1622 12/2016 blood type O+ [PS]    Clinical Course User Index [PS] Sharman Cheek, MD    ----------------------------------------- 6:12 PM on 07/21/2023 ----------------------------------------- Ultrasound unremarkable, RhoGAM not indicated.  UA negative for bacteriuria/UTI.  Stable for discharge.  Counseled on threatened miscarriage and follow-up with prenatal care.   FINAL CLINICAL IMPRESSION(S) / ED DIAGNOSES   Final diagnoses:  Vaginal bleeding in pregnancy, second trimester     Rx / DC Orders   ED Discharge Orders          Ordered    metoCLOPramide  (REGLAN) 10 MG tablet  Every 6 hours PRN        07/21/23 1636             Note:  This document was prepared using Dragon voice recognition software and may include unintentional dictation errors.   Sharman Cheek, MD 07/21/23 [phone]

## 2023-07-21 NOTE — ED Triage Notes (Signed)
Pt to ED for vaginal bleeding started today. [redacted] weeks pregnant. Reports has gone through 1 pad today. Reports abd cramping

## 2023-07-23 ENCOUNTER — Telehealth: Payer: Self-pay | Admitting: Licensed Clinical Social Worker

## 2023-07-23 NOTE — Telephone Encounter (Signed)
07/20/2023 referred by Jola Schmidt, Care Manager.

## 2023-07-25 ENCOUNTER — Other Ambulatory Visit: Payer: Self-pay | Admitting: Primary Care

## 2023-07-25 DIAGNOSIS — Z3482 Encounter for supervision of other normal pregnancy, second trimester: Secondary | ICD-10-CM

## 2023-07-26 ENCOUNTER — Encounter: Payer: Self-pay | Admitting: Emergency Medicine

## 2023-07-26 ENCOUNTER — Emergency Department
Admission: EM | Admit: 2023-07-26 | Discharge: 2023-07-26 | Discharge status: Against Medical Advice | Payer: Self-pay | Attending: Student | Admitting: Student

## 2023-07-26 ENCOUNTER — Other Ambulatory Visit: Payer: Self-pay

## 2023-07-26 ENCOUNTER — Emergency Department: Payer: Self-pay

## 2023-07-26 DIAGNOSIS — Z5321 Procedure and treatment not carried out due to patient leaving prior to being seen by health care provider: Secondary | ICD-10-CM | POA: Insufficient documentation

## 2023-07-26 DIAGNOSIS — R102 Pelvic and perineal pain: Secondary | ICD-10-CM | POA: Insufficient documentation

## 2023-07-26 DIAGNOSIS — M549 Dorsalgia, unspecified: Secondary | ICD-10-CM | POA: Diagnosis not present

## 2023-07-26 LAB — CBC WITH DIFFERENTIAL/PLATELET
Abs Immature Granulocytes: 0.01 10*3/uL (ref 0.00–0.07)
Basophils Absolute: 0 10*3/uL (ref 0.0–0.1)
Basophils Relative: 1 %
Eosinophils Absolute: 0.1 10*3/uL (ref 0.0–0.5)
Eosinophils Relative: 1 %
HCT: 33.6 % — ABNORMAL LOW (ref 36.0–46.0)
Hemoglobin: 11.7 g/dL — ABNORMAL LOW (ref 12.0–15.0)
Immature Granulocytes: 0 %
Lymphocytes Relative: 43 %
Lymphs Abs: 2.5 10*3/uL (ref 0.7–4.0)
MCH: 32.7 pg (ref 26.0–34.0)
MCHC: 34.8 g/dL (ref 30.0–36.0)
MCV: 93.9 fL (ref 80.0–100.0)
Monocytes Absolute: 0.4 10*3/uL (ref 0.1–1.0)
Monocytes Relative: 7 %
Neutro Abs: 2.8 10*3/uL (ref 1.7–7.7)
Neutrophils Relative %: 48 %
Platelets: 171 10*3/uL (ref 150–400)
RBC: 3.58 MIL/uL — ABNORMAL LOW (ref 3.87–5.11)
RDW: 12.1 % (ref 11.5–15.5)
Smear Review: NORMAL
WBC: 5.7 10*3/uL (ref 4.0–10.5)
nRBC: 0 % (ref 0.0–0.2)

## 2023-07-26 LAB — BASIC METABOLIC PANEL
Anion gap: 6 (ref 5–15)
BUN: 9 mg/dL (ref 6–20)
CO2: 22 mmol/L (ref 22–32)
Calcium: 9 mg/dL (ref 8.9–10.3)
Chloride: 101 mmol/L (ref 98–111)
Creatinine, Ser: 0.49 mg/dL (ref 0.44–1.00)
GFR, Estimated: >60 mL/min (ref >60)
Glucose, Bld: 101 mg/dL — ABNORMAL HIGH (ref 70–99)
Potassium: 3.8 mmol/L (ref 3.5–5.1)
Sodium: 129 mmol/L — ABNORMAL LOW (ref 135–145)

## 2023-07-26 LAB — HCG, QUANTITATIVE, PREGNANCY: hCG, Beta Chain, Quant, S: 22236 m[IU]/mL — ABNORMAL HIGH (ref <5)

## 2023-07-26 NOTE — ED Provider Triage Note (Signed)
Emergency Medicine Provider Triage Evaluation Note  Kimberly Powers , a 27 y.o. female  was evaluated in triage.  Pt complains of pelvic pain and back pain that began at work today. Reports that she had bleeding last week and was put on rest last week. No bleeding since. 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  Appropriate orders placed.  Kimberly Powers was informed that the remainder of the evaluation will be completed by another provider, this initial triage assessment does not replace that evaluation, and the importance of remaining in the ED until their evaluation is complete.     Kimberly Hoehn, PA-C 07/26/23 1321

## 2023-07-26 NOTE — ED Notes (Signed)
Pt called to treatment room x 3 , no answer

## 2023-07-26 NOTE — ED Triage Notes (Signed)
Pt presents for severe lower back and abd pain that started at work today. She is [redacted]wks pregnant and was seen 12/7 for vaginal bleeding. She denies vaginal bleeding Endorses emesis, vaginal discharge G2, P1

## 2023-07-27 LAB — OB RESULTS CONSOLE GC/CHLAMYDIA
Chlamydia: NEGATIVE
Neisseria Gonorrhea: NEGATIVE

## 2023-08-15 NOTE — L&D Delivery Note (Signed)
 Delivery Note  First Stage: Labor onset: 0900 Augmentation : none Analgesia /Anesthesia intrapartum: epidural SROM at 0213  Second Stage: Complete dilation at 0215, cervical lip was present for the first two pushing contractions, held back and patient was able to push past the lip Onset of pushing at 0213 FHR second stage Category II, recurrent decelerations to the 90s with contractions, resolved and reassuring between contractions, reassured from imminent delivery  Delivery of a viable female infant 01/14/2024 at 0234 by Lavanda Porter, CNM. delivery of fetal head in OA position with restitution to ROA. No nuchal cord;  Anterior then posterior shoulders delivered easily with gentle downward traction. Baby placed on mom's chest, and attended to by peds.  Cord double clamped after cessation of pulsation, cut by FOB Cord blood sample collected   Third Stage: Placenta delivered Ileana Mallard intact with 3 VC @ 0243 Placenta disposition: discarded Uterine tone firm / bleeding scant  No laceration identified  Anesthesia for repair: n/a Repair none needed Est. Blood Loss (mL): 75ml  Complications: none  Mom to postpartum.  Baby to Couplet care / Skin to Skin.  Newborn: Birth Weight: TBD, infant skin-to-skin  Apgar Scores: 9, 9 Feeding planned: formula

## 2023-09-06 ENCOUNTER — Ambulatory Visit
Admission: RE | Admit: 2023-09-06 | Discharge: 2023-09-06 | Disposition: A | Discharge status: Home / Self Care | Payer: Medicaid Other | Source: Ambulatory Visit | Attending: Primary Care | Admitting: Primary Care

## 2023-09-06 DIAGNOSIS — Z3A20 20 weeks gestation of pregnancy: Secondary | ICD-10-CM | POA: Diagnosis not present

## 2023-09-06 DIAGNOSIS — Z3482 Encounter for supervision of other normal pregnancy, second trimester: Secondary | ICD-10-CM

## 2023-09-13 ENCOUNTER — Telehealth: Payer: Self-pay | Admitting: Licensed Clinical Social Worker

## 2023-09-13 NOTE — Telephone Encounter (Signed)
Patient referred by Jola Schmidt, Care Manager.   LCSW spoke with Jola Schmidt, Care Manage and informed her that the number was not working.

## 2023-09-21 ENCOUNTER — Other Ambulatory Visit: Payer: Self-pay | Admitting: Primary Care

## 2023-09-21 DIAGNOSIS — Z3482 Encounter for supervision of other normal pregnancy, second trimester: Secondary | ICD-10-CM

## 2023-10-22 ENCOUNTER — Ambulatory Visit
Admission: RE | Admit: 2023-10-22 | Discharge: 2023-10-22 | Disposition: A | Discharge status: Home / Self Care | Source: Ambulatory Visit | Attending: Primary Care | Admitting: Primary Care

## 2023-10-22 DIAGNOSIS — Z3A24 24 weeks gestation of pregnancy: Secondary | ICD-10-CM | POA: Diagnosis not present

## 2023-10-22 DIAGNOSIS — Z3482 Encounter for supervision of other normal pregnancy, second trimester: Secondary | ICD-10-CM | POA: Diagnosis present

## 2023-11-20 LAB — OB RESULTS CONSOLE GBS: GBS: POSITIVE

## 2024-01-09 ENCOUNTER — Other Ambulatory Visit: Payer: Self-pay | Admitting: Certified Nurse Midwife

## 2024-01-09 DIAGNOSIS — Z3490 Encounter for supervision of normal pregnancy, unspecified, unspecified trimester: Secondary | ICD-10-CM

## 2024-01-09 NOTE — Progress Notes (Signed)
 G4P1001 at [redacted]w[redacted]d, LMP of 04/02/24, c/w US  at [redacted]w[redacted]d.  Scheduled for Post dates. Due date 01/08/24 induction of labor on 01/15/24 @0500 .   Prenatal provider: Stephenie Einstein Pregnancy complicated by:   Prenatal Labs: Blood type/Rh O POS  Antibody screen neg  Rubella immune    Varicella Immune  RPR NR  HBsAg   Neg  Hep C NR  HIV   NR  GC neg  Chlamydia neg  Genetic screening cfDNA negative  1 hour GTT 106  3 hour GTT N/a  GBS   neg   - Tdap vaccine: Given prenatally - Flu vaccine: Given prenatally -RSV vaccine:out of season  Contraception:TBD Feeding preference: TBD  ____ Arzella Laurence, CNM Certified Nurse Midwife [place]  [facility]

## 2024-01-14 ENCOUNTER — Other Ambulatory Visit: Payer: Self-pay

## 2024-01-14 ENCOUNTER — Inpatient Hospital Stay: Admitting: Anesthesiology

## 2024-01-14 ENCOUNTER — Inpatient Hospital Stay
Admission: EM | Admit: 2024-01-14 | Discharge: 2024-01-15 | DRG: 807 | Disposition: A | Discharge status: Home / Self Care | Attending: Obstetrics and Gynecology | Admitting: Obstetrics and Gynecology

## 2024-01-14 DIAGNOSIS — O99330 Smoking (tobacco) complicating pregnancy, unspecified trimester: Secondary | ICD-10-CM

## 2024-01-14 DIAGNOSIS — O26893 Other specified pregnancy related conditions, third trimester: Secondary | ICD-10-CM | POA: Diagnosis present

## 2024-01-14 DIAGNOSIS — F419 Anxiety disorder, unspecified: Secondary | ICD-10-CM | POA: Diagnosis present

## 2024-01-14 DIAGNOSIS — O99334 Smoking (tobacco) complicating childbirth: Secondary | ICD-10-CM | POA: Diagnosis present

## 2024-01-14 DIAGNOSIS — O99824 Streptococcus B carrier state complicating childbirth: Secondary | ICD-10-CM | POA: Diagnosis present

## 2024-01-14 DIAGNOSIS — F17210 Nicotine dependence, cigarettes, uncomplicated: Secondary | ICD-10-CM | POA: Diagnosis present

## 2024-01-14 DIAGNOSIS — Z3A40 40 weeks gestation of pregnancy: Secondary | ICD-10-CM | POA: Diagnosis not present

## 2024-01-14 DIAGNOSIS — O09299 Supervision of pregnancy with other poor reproductive or obstetric history, unspecified trimester: Secondary | ICD-10-CM

## 2024-01-14 DIAGNOSIS — O480 Post-term pregnancy: Principal | ICD-10-CM | POA: Diagnosis present

## 2024-01-14 DIAGNOSIS — Z3490 Encounter for supervision of normal pregnancy, unspecified, unspecified trimester: Secondary | ICD-10-CM

## 2024-01-14 LAB — CBC
HCT: 38.4 % (ref 36.0–46.0)
Hemoglobin: 13.4 g/dL (ref 12.0–15.0)
MCH: 32.1 pg (ref 26.0–34.0)
MCHC: 34.9 g/dL (ref 30.0–36.0)
MCV: 91.9 fL (ref 80.0–100.0)
Platelets: 139 10*3/uL — ABNORMAL LOW (ref 150–400)
RBC: 4.18 MIL/uL (ref 3.87–5.11)
RDW: 12.9 % (ref 11.5–15.5)
WBC: 7.4 10*3/uL (ref 4.0–10.5)
nRBC: 0 % (ref 0.0–0.2)

## 2024-01-14 LAB — TYPE AND SCREEN
ABO/RH(D): O POS
Antibody Screen: NEGATIVE

## 2024-01-14 LAB — RPR: RPR Ser Ql: NONREACTIVE

## 2024-01-14 MED ORDER — OXYTOCIN-SODIUM CHLORIDE 30-0.9 UT/500ML-% IV SOLN: 1.0000 m[IU]/min | INTRAVENOUS | Status: DC

## 2024-01-14 MED ORDER — OXYTOCIN-SODIUM CHLORIDE 30-0.9 UT/500ML-% IV SOLN
2.5000 [IU]/h | INTRAVENOUS | Status: DC
  Administered 2024-01-14: 2.5 [IU]/h via INTRAVENOUS
  Filled 2024-01-14: qty 500

## 2024-01-14 MED ORDER — LIDOCAINE HCL (PF) 1 % IJ SOLN: 30.0000 mL | INTRAMUSCULAR | Status: DC | PRN

## 2024-01-14 MED ORDER — PRENATAL MULTIVITAMIN CH
1.0000 | ORAL_TABLET | Freq: Every day | ORAL | Status: DC
  Administered 2024-01-14 – 2024-01-15 (×2): 1 via ORAL
  Filled 2024-01-14 (×2): qty 1

## 2024-01-14 MED ORDER — SENNOSIDES-DOCUSATE SODIUM 8.6-50 MG PO TABS
2.0000 | ORAL_TABLET | Freq: Every day | ORAL | Status: DC
  Administered 2024-01-15: 2 via ORAL
  Filled 2024-01-14: qty 2

## 2024-01-14 MED ORDER — MISOPROSTOL 25 MCG QUARTER TABLET: 25.0000 ug | ORAL_TABLET | ORAL | Status: DC

## 2024-01-14 MED ORDER — COCONUT OIL OIL: 1.0000 | TOPICAL_OIL | Status: DC | PRN

## 2024-01-14 MED ORDER — ONDANSETRON HCL 4 MG PO TABS: 4.0000 mg | ORAL_TABLET | ORAL | Status: DC | PRN

## 2024-01-14 MED ORDER — ACETAMINOPHEN 325 MG PO TABS: 650.0000 mg | ORAL_TABLET | ORAL | Status: DC | PRN

## 2024-01-14 MED ORDER — ONDANSETRON HCL 4 MG/2ML IJ SOLN: 4.0000 mg | Freq: Four times a day (QID) | INTRAMUSCULAR | Status: DC | PRN

## 2024-01-14 MED ORDER — SOD CITRATE-CITRIC ACID 500-334 MG/5ML PO SOLN: 30.0000 mL | ORAL | Status: DC | PRN

## 2024-01-14 MED ORDER — OXYTOCIN 10 UNIT/ML IJ SOLN
INTRAMUSCULAR | Status: DC
Start: 2024-01-14 — End: 2024-01-14
  Filled 2024-01-14: qty 2

## 2024-01-14 MED ORDER — SIMETHICONE 80 MG PO CHEW: 80.0000 mg | CHEWABLE_TABLET | ORAL | Status: DC | PRN

## 2024-01-14 MED ORDER — AMMONIA AROMATIC IN INHA
RESPIRATORY_TRACT | Status: AC
  Filled 2024-01-14: qty 10

## 2024-01-14 MED ORDER — OXYTOCIN BOLUS FROM INFUSION: 333.0000 mL | Freq: Once | INTRAVENOUS | Status: DC

## 2024-01-14 MED ORDER — PHENYLEPHRINE 80 MCG/ML (10ML) SYRINGE FOR IV PUSH (FOR BLOOD PRESSURE SUPPORT): 80.0000 ug | PREFILLED_SYRINGE | INTRAVENOUS | Status: DC | PRN

## 2024-01-14 MED ORDER — TERBUTALINE SULFATE 1 MG/ML IJ SOLN: 0.2500 mg | Freq: Once | INTRAMUSCULAR | Status: DC | PRN

## 2024-01-14 MED ORDER — SODIUM CHLORIDE 0.9 % IV SOLN
INTRAVENOUS | Status: DC | PRN
  Administered 2024-01-14: 8 mL via EPIDURAL

## 2024-01-14 MED ORDER — WITCH HAZEL-GLYCERIN EX PADS: 1.0000 | MEDICATED_PAD | CUTANEOUS | Status: DC | PRN

## 2024-01-14 MED ORDER — BENZOCAINE-MENTHOL 20-0.5 % EX AERO: 1.0000 | INHALATION_SPRAY | CUTANEOUS | Status: DC | PRN

## 2024-01-14 MED ORDER — EPHEDRINE 5 MG/ML INJ: 10.0000 mg | INTRAVENOUS | Status: DC | PRN

## 2024-01-14 MED ORDER — LACTATED RINGERS IV SOLN
500.0000 mL | Freq: Once | INTRAVENOUS | Status: AC
  Administered 2024-01-14: 500 mL via INTRAVENOUS

## 2024-01-14 MED ORDER — LIDOCAINE-EPINEPHRINE (PF) 1.5 %-1:200000 IJ SOLN
INTRAMUSCULAR | Status: DC | PRN
  Administered 2024-01-14: 3 mL via EPIDURAL

## 2024-01-14 MED ORDER — ZOLPIDEM TARTRATE 5 MG PO TABS: 5.0000 mg | ORAL_TABLET | Freq: Every evening | ORAL | Status: DC | PRN

## 2024-01-14 MED ORDER — FENTANYL-BUPIVACAINE-NACL 0.5-0.125-0.9 MG/250ML-% EP SOLN
12.0000 mL/h | EPIDURAL | Status: DC | PRN
  Administered 2024-01-14: 12 mL/h via EPIDURAL

## 2024-01-14 MED ORDER — MISOPROSTOL 200 MCG PO TABS
ORAL_TABLET | ORAL | Status: AC
  Filled 2024-01-14: qty 4

## 2024-01-14 MED ORDER — LIDOCAINE HCL (PF) 1 % IJ SOLN
INTRAMUSCULAR | Status: AC
  Filled 2024-01-14: qty 30

## 2024-01-14 MED ORDER — FENTANYL-BUPIVACAINE-NACL 0.5-0.125-0.9 MG/250ML-% EP SOLN
EPIDURAL | Status: AC
  Filled 2024-01-14: qty 250

## 2024-01-14 MED ORDER — MEDROXYPROGESTERONE ACETATE 150 MG/ML IM SUSP
150.0000 mg | INTRAMUSCULAR | Status: DC | PRN
  Filled 2024-01-14: qty 1

## 2024-01-14 MED ORDER — LACTATED RINGERS IV SOLN: 500.0000 mL | INTRAVENOUS | Status: DC | PRN

## 2024-01-14 MED ORDER — ONDANSETRON HCL 4 MG/2ML IJ SOLN: 4.0000 mg | INTRAMUSCULAR | Status: DC | PRN

## 2024-01-14 MED ORDER — FENTANYL CITRATE (PF) 100 MCG/2ML IJ SOLN: 50.0000 ug | INTRAMUSCULAR | Status: DC | PRN

## 2024-01-14 MED ORDER — DIPHENHYDRAMINE HCL 50 MG/ML IJ SOLN: 12.5000 mg | INTRAMUSCULAR | Status: DC | PRN

## 2024-01-14 MED ORDER — DIPHENHYDRAMINE HCL 25 MG PO CAPS: 25.0000 mg | ORAL_CAPSULE | Freq: Four times a day (QID) | ORAL | Status: DC | PRN

## 2024-01-14 MED ORDER — IBUPROFEN 600 MG PO TABS
600.0000 mg | ORAL_TABLET | Freq: Four times a day (QID) | ORAL | Status: DC
  Administered 2024-01-14 – 2024-01-15 (×5): 600 mg via ORAL
  Filled 2024-01-14 (×6): qty 1

## 2024-01-14 MED ORDER — LIDOCAINE HCL (PF) 1 % IJ SOLN
INTRAMUSCULAR | Status: DC | PRN
  Administered 2024-01-14: 3 mL via SUBCUTANEOUS

## 2024-01-14 MED ORDER — DIBUCAINE (PERIANAL) 1 % EX OINT: 1.0000 | TOPICAL_OINTMENT | CUTANEOUS | Status: DC | PRN

## 2024-01-14 MED ORDER — LACTATED RINGERS IV SOLN: INTRAVENOUS | Status: DC
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
## 2024-01-14 NOTE — Discharge Summary (Signed)
 Postpartum Discharge Summary  Patient Name: Kimberly Powers DOB: Oct 22, 1995 MRN: [id number]  Date of admission: 01/14/2024 Delivery date:01/14/2024 Delivering provider: Lavanda Porter RENEE Date of discharge: 01/15/2024  Primary OB: [facility] [id number] last menstrual period was 04/06/2023 (exact date). EDC Estimated Date of Delivery: 01/08/24 Gestational Age at Delivery: [redacted]w[redacted]d   Admitting diagnosis: Post-dates pregnancy [O48.0] Intrauterine pregnancy: [redacted]w[redacted]d     Secondary diagnosis:   Principal Problem:   NSVD (normal spontaneous vaginal delivery) Active Problems:   Post-dates pregnancy   Anxiety and depression   History of delivery by vacuum extraction, currently pregnant   Tobacco smoking affecting pregnancy   Discharge Diagnosis: Term Pregnancy Delivered                                                Post partum procedures:None  Augmentation:: N/A Complications: None Delivery Type: spontaneous vaginal delivery Anesthesia: epidural anesthesia Placenta: spontaneous To Pathology: No  Laceration: none Episiotomy: none  Prenatal Labs:  Blood type/Rh O POS  Antibody screen neg  Rubella immune    Varicella Immune  RPR NR  HBsAg   Neg  Hep C NR  HIV   NR  GC neg  Chlamydia neg  Genetic screening cfDNA negative  1 hour GTT 106  3 hour GTT N/a  GBS   neg    Hospital course: Onset of Labor With Vaginal Delivery      28 y.o. yo [id number] at [redacted]w[redacted]d was admitted in Active Labor on 01/14/2024. Labor course was without complication. She arrived to L&D 9.5/100/+1 with BBOW, got an epidural, SROM, then pushed , delivering viable female infant over intact perineum. Apgars 9/9. Membrane Rupture Time/Date: 2:13 AM,01/14/2024  Delivery Method:Vaginal, Spontaneous Operative Delivery:N/A Episiotomy: None Lacerations:  None Patient had an uncomplicated postpartum course.  She is ambulating, tolerating a regular diet, passing flatus, and urinating well. Patient is discharged  home in stable condition on 01/15/24.  Newborn Data: Birth date:01/14/2024 Birth time:2:34 AM Gender:Female Living status:Living Apgars:9 ,9  Weight:2540 g  Magnesium  Sulfate received: No BMZ received: No Rhophylac:No MMR:No Varivax vaccine given: was not indicated T-DaP:Given prenatally Flu: N/A  Transfusion:No  Physical exam  Vitals:   01/14/24 1245 01/14/24 1620 01/15/24 0047 01/15/24 0840  BP: 111/76 128/70 124/81 112/74  Pulse: (!) 58 (!) 53 (!) 52 (!) 51  Resp: 18 20 18 18   Temp: 98.2 F (36.8 C) 98.3 F (36.8 C) 97.8 F (36.6 C) 97.8 F (36.6 C)  TempSrc: Oral Oral Oral Oral  SpO2: 100% 100% 100% 100%   General: alert, cooperative, and no distress Lochia: appropriate Uterine Fundus: firm Perineum: minimal edema/intact DVT Evaluation: No evidence of DVT seen on physical exam.  Labs: Lab Results  Component Value Date   WBC 7.0 01/15/2024   HGB 12.0 01/15/2024   HCT 35.6 (L) 01/15/2024   MCV 93.7 01/15/2024   PLT 142 (L) 01/15/2024      Latest Ref Rng & Units 07/26/2023    1:25 PM  CMP  Glucose 70 - 99 mg/dL 086   BUN 6 - 20 mg/dL 9   Creatinine 5.78 - 4.69 mg/dL 6.29   Sodium 528 - 413 mmol/L 129   Potassium 3.5 - 5.1 mmol/L 3.8   Chloride 98 - 111 mmol/L 101   CO2 22 - 32 mmol/L 22   Calcium 8.9 - 10.3 mg/dL 9.0  Edinburgh Score:    01/15/2024    3:41 AM  Dimple Francis Postnatal Depression Scale Screening Tool  I have been able to laugh and see the funny side of things. 0  I have looked forward with enjoyment to things. 0  I have blamed myself unnecessarily when things went wrong. 2  I have been anxious or worried for no good reason. 2  I have felt scared or panicky for no good reason. 0  Things have been getting on top of me. 0  I have been so unhappy that I have had difficulty sleeping. 0  I have felt sad or miserable. 0  I have been so unhappy that I have been crying. 0  The thought of harming myself has occurred to me. 0  Edinburgh  Postnatal Depression Scale Total 4    Risk assessment for postpartum VTE and prophylactic treatment: Very high risk factors: None High risk factors: None Moderate risk factors: None  Postpartum VTE prophylaxis with LMWH not indicated  After visit meds:  Allergies as of 01/15/2024   No Known Allergies      Medication List     STOP taking these medications    cephALEXin  500 MG capsule Commonly known as: KEFLEX    fluconazole  150 MG tablet Commonly known as: Diflucan    metoCLOPramide  10 MG tablet Commonly known as: REGLAN    ondansetron  4 MG disintegrating tablet Commonly known as: ZOFRAN -ODT       TAKE these medications    acetaminophen  325 MG tablet Commonly known as: Tylenol  Take 2 tablets (650 mg total) by mouth every 4 (four) hours as needed (for pain scale < 4). What changed:  medication strength how much to take when to take this reasons to take this   benzocaine -Menthol  20-0.5 % Aero Commonly known as: DERMOPLAST Apply 1 Application topically as needed for irritation (perineal discomfort).   dibucaine 1 % Oint Commonly known as: NUPERCAINAL Place 1 Application rectally as needed for hemorrhoids.   ibuprofen  600 MG tablet Commonly known as: ADVIL  Take 1 tablet (600 mg total) by mouth every 6 (six) hours.   senna-docusate 8.6-50 MG tablet Commonly known as: Senokot-S Take 2 tablets by mouth daily.   witch hazel-glycerin  pad Commonly known as: TUCKS Apply 1 Application topically as needed for hemorrhoids.       Discharge home in stable condition Infant Feeding: Bottle Infant Disposition:home with mother Discharge instruction: per After Visit Summary and Postpartum booklet. Activity: Advance as tolerated. Pelvic rest for 6 weeks.  Diet: routine diet Anticipated Birth Control: PP Depo given Postpartum Appointment:6 weeks Additional Postpartum F/U: Postpartum Depression checkup Future Appointments:No future appointments. Follow up Visit:   Follow-up Information     [facility] Follow up in 2 week(s).   Specialty: General Practice Why: 2wk mood check Contact information: [address] [place] [address] [phone]         [facility] Follow up in 6 week(s).   Specialty: General Practice Why: 6wk postpartum Contact information: [address] [place] [address] [phone]                 Plan:  HAYDN CUSH was discharged to home in good condition. - Follow-up appointment as directed.    Signed:  Porfiria Heinrich, CNM 01/15/2024 9:15 AM
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
## 2024-01-14 NOTE — Anesthesia Procedure Notes (Signed)
 Epidural Patient location during procedure: OB  Staffing Anesthesiologist: Lattie Poli, MD Performed: anesthesiologist   Preanesthetic Checklist Completed: patient identified, IV checked, site marked, risks and benefits discussed, surgical consent, monitors and equipment checked, pre-op evaluation and timeout performed  Epidural Patient position: sitting Prep: ChloraPrep Patient monitoring: heart rate, continuous pulse ox and blood pressure Approach: midline Location: L4-L5 Injection technique: LOR saline  Needle:  Needle type: Tuohy  Needle gauge: 17 G Needle length: 9 cm Needle insertion depth: 6 cm Catheter type: closed end flexible Catheter size: 19 Gauge Catheter at skin depth: 11 cm Test dose: negative and 1.5% lidocaine  with Epi 1:200 K  Assessment Sensory level: T10 Events: blood not aspirated, no cerebrospinal fluid, injection not painful, no injection resistance, no paresthesia and negative IV test  Additional Notes 1st/one attempt Pt. Evaluated and documentation done after procedure finished. Patient identified. Risks/Benefits/Options discussed with patient including but not limited to bleeding, infection, nerve damage, paralysis, failed block, incomplete pain control, headache, blood pressure changes, nausea, vomiting, reactions to medication both or allergic, itching and postpartum back pain. Confirmed with bedside nurse the patient's most recent platelet count. Confirmed with patient that they are not currently taking any anticoagulation, have any bleeding history or any family history of bleeding disorders. Patient expressed understanding and wished to proceed. All questions were answered. Sterile technique was used throughout the entire procedure. Please see nursing notes for vital signs. Test dose was given through epidural catheter and negative prior to continuing to dose epidural or start infusion. Warning signs of high block given to the patient including  shortness of breath, tingling/numbness in hands, complete motor block, or any concerning symptoms with instructions to call for help. Patient was given instructions on fall risk and not to get out of bed. All questions and concerns addressed with instructions to call with any issues or inadequate analgesia.     Patient tolerated the insertion well without immediate complications.  Reason for block: procedure for painReason for block:procedure for pain

## 2024-01-15 LAB — CBC
HCT: 35.6 % — ABNORMAL LOW (ref 36.0–46.0)
Hemoglobin: 12 g/dL (ref 12.0–15.0)
MCH: 31.6 pg (ref 26.0–34.0)
MCHC: 33.7 g/dL (ref 30.0–36.0)
MCV: 93.7 fL (ref 80.0–100.0)
Platelets: 142 10*3/uL — ABNORMAL LOW (ref 150–400)
RBC: 3.8 MIL/uL — ABNORMAL LOW (ref 3.87–5.11)
RDW: 13.3 % (ref 11.5–15.5)
WBC: 7 10*3/uL (ref 4.0–10.5)
nRBC: 0 % (ref 0.0–0.2)

## 2024-01-15 MED ORDER — SENNOSIDES-DOCUSATE SODIUM 8.6-50 MG PO TABS: 2.0000 | ORAL_TABLET | Freq: Every day | ORAL | Status: AC

## 2024-01-15 MED ORDER — ACETAMINOPHEN 325 MG PO TABS: 650.0000 mg | ORAL_TABLET | ORAL | Status: AC | PRN

## 2024-01-15 MED ORDER — DIBUCAINE (PERIANAL) 1 % EX OINT: 1.0000 | TOPICAL_OINTMENT | CUTANEOUS | Status: AC | PRN

## 2024-01-15 MED ORDER — IBUPROFEN 600 MG PO TABS: 600.0000 mg | ORAL_TABLET | Freq: Four times a day (QID) | ORAL | 0 refills | Status: AC

## 2024-01-15 MED ORDER — WITCH HAZEL-GLYCERIN EX PADS: 1.0000 | MEDICATED_PAD | CUTANEOUS | Status: AC | PRN

## 2024-01-15 MED ORDER — BENZOCAINE-MENTHOL 20-0.5 % EX AERO: 1.0000 | INHALATION_SPRAY | CUTANEOUS | Status: AC | PRN

## 2024-01-15 NOTE — Anesthesia Postprocedure Evaluation (Signed)
 Anesthesia Post Note  Patient: Kimberly Powers  Procedure(s) Performed: AN AD HOC LABOR EPIDURAL  Patient location during evaluation: Mother Baby Anesthesia Type: Epidural Level of consciousness: awake and alert Pain management: pain level controlled Vital Signs Assessment: post-procedure vital signs reviewed and stable Respiratory status: spontaneous breathing, nonlabored ventilation and respiratory function stable Cardiovascular status: stable Postop Assessment: no headache, no backache and epidural receding Anesthetic complications: no   No notable events documented.   Last Vitals:  Vitals:   01/14/24 1620 01/15/24 0047  BP: 128/70 124/81  Pulse: (!) 53 (!) 52  Resp: 20 18  Temp: 36.8 C 36.6 C  SpO2: 100% 100%    Last Pain:  Vitals:   01/15/24 0604  TempSrc:   PainSc: 5                  Iyonna Rish

## 2024-01-15 NOTE — Progress Notes (Signed)
 Patient discharged. Discharge instructions given. Patient verbalizes understanding. Transported by axillary.

## 2024-01-15 NOTE — Discharge Instructions (Signed)
Discharge instructions Bleeding: Your bleeding could continue up to 6 weeks, the flow should gradually decrease and the color should become dark then lightened over the next couple of weeks. If you notice you are bleeding heavily or passing clots larger than the size of your fist, PLEASE call your physician. No TAMPONS, DOUCHING, ENEMAS OR SEXUAL INTERCOURSE for 6 weeks. Stitches: Shower daily with mild soap and water. Stitches will dissolve over the next couple of weeks, if you experience any discomfort in the vaginal area you may sit in warm water 15-20 minutes, 3-4 times per day. Just enough water to cover vaginal area. AfterPains: This is the uterus contracting back to its normal position and size. Use medications prescribed or recommended by your physician to help relieve this discomfort. Bowels/Hemorrhoids: Drink plenty of water and stay active. Increase fiber, fresh fruits and vegetables in your diet. Rest/Activity: Rest when the baby is resting;  Do not lift > 10 lbs for 6 weeks. No driving for 1-2 weeks. Bathing: Shower daily! Diet: Continue daily prenatal vitamin and iron until your follow up visit to help replenish nutrients and vitamins. If breastfeeding eat extra calories and increase your fluid intake to 12 glasses a day. Contraception: Consult with your provider on what method of birth control you would like to use. Breastfeeding: You may have a slight fever when your milk comes in, but it should go away on its own. If it does not, and rises above 101.0 please call the doctor. Bottlefeeding: wear a snug fitting bra without underwires continuously for 3-5days, avoid any nipple/breast stimulation. If engorgement occurs, take ibuprofen as prescribed and apply fresh green cabbage leaves directly to your breasts inside the bra cups. Postpartum "BLUES": It is common to emotional days after delivery, however if it persist for greater than 2 weeks or if you feel concerned please let your physician  know immediately. This is hormone driven and nothing you can control so please let someone know how you feel. Follow Up Visit: Please schedule a follow up visit with your delivering provider  Call office if you have any of the following: headache, visual changes, fever >101.0 F, chills, breast concerns, excessive vaginal bleeding, incision drainage or problems, leg pain or redness, depression or any other concerns.  For concerns about your baby, please call your pediatrician For breastfeeding concerns, the lactation consultant can be reached at [phone]

## 2024-01-17 ENCOUNTER — Emergency Department
Admission: EM | Admit: 2024-01-17 | Discharge: 2024-01-17 | Disposition: A | Discharge status: Home / Self Care | Attending: Emergency Medicine | Admitting: Emergency Medicine

## 2024-01-17 DIAGNOSIS — O872 Hemorrhoids in the puerperium: Secondary | ICD-10-CM | POA: Diagnosis not present

## 2024-01-17 DIAGNOSIS — K648 Other hemorrhoids: Secondary | ICD-10-CM

## 2024-01-17 DIAGNOSIS — O9089 Other complications of the puerperium, not elsewhere classified: Secondary | ICD-10-CM | POA: Diagnosis present

## 2024-01-17 MED ORDER — LIDOCAINE 5 % EX OINT: 1.0000 | TOPICAL_OINTMENT | CUTANEOUS | 0 refills | Status: AC | PRN

## 2024-01-17 MED ORDER — LIDOCAINE-PRILOCAINE 2.5-2.5 % EX CREA
TOPICAL_CREAM | Freq: Once | CUTANEOUS | Status: AC
  Filled 2024-01-17: qty 5

## 2024-01-17 MED ORDER — POLYETHYLENE GLYCOL 3350 17 GM/SCOOP PO POWD: ORAL | 0 refills | Status: AC

## 2024-01-17 MED ORDER — HYDROCORTISONE ACETATE 25 MG RE SUPP
25.0000 mg | Freq: Once | RECTAL | Status: AC
  Administered 2024-01-17: 25 mg via RECTAL
  Filled 2024-01-17: qty 1

## 2024-01-17 MED ORDER — HYDROCORTISONE ACETATE 25 MG RE SUPP: 25.0000 mg | Freq: Two times a day (BID) | RECTAL | 1 refills | Status: AC

## 2024-01-17 NOTE — ED Provider Notes (Addendum)
 [facility] Provider Note    Event Date/Time   First MD Initiated Contact with Patient 01/17/24 2048     (approximate)   History   Hemorrhoids   HPI  Kimberly Powers is a 28 y.o. female with recent vaginal delivery on June 2 presents emergency department complaining of rectal pain.  Patient states that there is a very large hemorrhoid in that area has become painful.  States it never had a hemorrhoid before.      Physical Exam   Triage Vital Signs: ED Triage Vitals [01/17/24 1859]  Encounter Vitals Group     BP 127/81     Systolic BP Percentile      Diastolic BP Percentile      Pulse Rate (!) 50     Resp 18     Temp 98.7 F (37.1 C)     Temp Source Oral     SpO2 99 %     Weight      Height      Head Circumference      Peak Flow      Pain Score 10     Pain Loc      Pain Education      Exclude from Growth Chart     Most recent vital signs: Vitals:   01/17/24 1859 01/17/24 2159  BP: 127/81   Pulse: (!) 50 66  Resp: 18 18  Temp: 98.7 F (37.1 C)   SpO2: 99% 100%     General: Awake, no distress.   CV:  Good peripheral perfusion. Resp:  Normal effort.  Abd:  No distention.   Other:  Rectal exam shows a prolapse versus hemorrhoid that is three quarters of the way around the anus, area is tender to palpation, no dark-colored bruising etc.   ED Results / Procedures / Treatments   Labs (all labs ordered are listed, but only abnormal results are displayed) Labs Reviewed - No data to display   EKG     RADIOLOGY     PROCEDURES:   Procedures  Critical Care:  no Chief Complaint  Patient presents with   Hemorrhoids      MEDICATIONS ORDERED IN ED: Medications  lidocaine -prilocaine  (EMLA ) cream ( Topical Given 01/17/24 2132)  hydrocortisone  (ANUSOL -HC) suppository 25 mg (25 mg Rectal Given 01/17/24 2149)     IMPRESSION / MDM / ASSESSMENT AND PLAN / ED COURSE  I reviewed the triage vital signs and the nursing  notes.                              Differential diagnosis includes, but is not limited to, thrombosed hemorrhoid, hemorrhoid, rectal prolapse  Patient's presentation is most consistent with acute illness / injury with system symptoms.   Cardiac monitor no Medications given: Anusol  HC, Emla  cream  Since the area is a questionable rectal prolapse to try applying sugar to the area  Area did not shrink with the application of sugar.  Appears to be a small amount smaller but not a lot.  Area was washed with normal saline.  We did apply Emla  cream and a Anusol  suppository.  Area does not appear to be thrombosed so do not want to make an incision at this time.  She is to follow-up with GYN tomorrow.  Return if worsening.  In agreement treatment plan.  Discharged stable condition.      FINAL CLINICAL IMPRESSION(S) / ED DIAGNOSES  Final diagnoses:  Hemorrhoid prolapse     Rx / DC Orders   ED Discharge Orders          Ordered    hydrocortisone  (ANUSOL -HC) 25 MG suppository  Every 12 hours        01/17/24 2150    lidocaine  (XYLOCAINE ) 5 % ointment  As needed        01/17/24 2150    polyethylene glycol powder (GLYCOLAX /MIRALAX ) 17 GM/SCOOP powder        01/17/24 2150             Note:  This document was prepared using Dragon voice recognition software and may include unintentional dictation errors.    Delsie Figures, PA-C 01/17/24 2153    Shane Darling, MD 01/17/24 2252    Delsie Figures, PA-C 01/18/24 0005    Shane Darling, MD 01/22/24 [phone]

## 2024-01-17 NOTE — ED Triage Notes (Signed)
 Pt presents to the ED via POV from home for hemorrhoid. Pt gave birth naturally on 6/2. Denies rectal bleeding

## 2024-01-17 NOTE — ED Notes (Signed)
 Pt presented to ED with c/o rectal pain, states she believes she has a hemorrhoid. States did not try OTC medications prior. States give vaginal birth on 01/14/24. Denies rectal bleeding.

## 2024-01-17 NOTE — Discharge Instructions (Signed)
 Warm sitz bath's Witch hazel Anusol HC Lidocaine  cream Use these medications to help with the swollen area.  If it becomes more painful please return emergency department for evaluation of thrombosed hemorrhoid.  Please see your GYN doctor tomorrow

## 2024-03-10 ENCOUNTER — Ambulatory Visit

## 2024-03-23 ENCOUNTER — Emergency Department
Admission: EM | Admit: 2024-03-23 | Discharge: 2024-03-23 | Disposition: A | Discharge status: Home / Self Care | Attending: Emergency Medicine | Admitting: Emergency Medicine

## 2024-03-23 ENCOUNTER — Other Ambulatory Visit: Payer: Self-pay

## 2024-03-23 ENCOUNTER — Emergency Department

## 2024-03-23 DIAGNOSIS — K920 Hematemesis: Secondary | ICD-10-CM | POA: Insufficient documentation

## 2024-03-23 DIAGNOSIS — N939 Abnormal uterine and vaginal bleeding, unspecified: Secondary | ICD-10-CM | POA: Insufficient documentation

## 2024-03-23 LAB — CBC WITH DIFFERENTIAL/PLATELET
Abs Immature Granulocytes: 0.01 K/uL (ref 0.00–0.07)
Basophils Absolute: 0 K/uL (ref 0.0–0.1)
Basophils Relative: 0 %
Eosinophils Absolute: 0 K/uL (ref 0.0–0.5)
Eosinophils Relative: 0 %
HCT: 39.2 % (ref 36.0–46.0)
Hemoglobin: 13.3 g/dL (ref 12.0–15.0)
Immature Granulocytes: 0 %
Lymphocytes Relative: 19 %
Lymphs Abs: 0.9 K/uL (ref 0.7–4.0)
MCH: 32.8 pg (ref 26.0–34.0)
MCHC: 33.9 g/dL (ref 30.0–36.0)
MCV: 96.8 fL (ref 80.0–100.0)
Monocytes Absolute: 0.2 K/uL (ref 0.1–1.0)
Monocytes Relative: 4 %
Neutro Abs: 3.7 K/uL (ref 1.7–7.7)
Neutrophils Relative %: 77 %
Platelets: 216 K/uL (ref 150–400)
RBC: 4.05 MIL/uL (ref 3.87–5.11)
RDW: 13.5 % (ref 11.5–15.5)
WBC: 4.9 K/uL (ref 4.0–10.5)
nRBC: 0 % (ref 0.0–0.2)

## 2024-03-23 LAB — COMPREHENSIVE METABOLIC PANEL WITH GFR
ALT: 41 U/L (ref 0–44)
AST: 42 U/L — ABNORMAL HIGH (ref 15–41)
Albumin: 4.1 g/dL (ref 3.5–5.0)
Alkaline Phosphatase: 85 U/L (ref 38–126)
Anion gap: 9 (ref 5–15)
BUN: 16 mg/dL (ref 6–20)
CO2: 19 mmol/L — ABNORMAL LOW (ref 22–32)
Calcium: 8.9 mg/dL (ref 8.9–10.3)
Chloride: 106 mmol/L (ref 98–111)
Creatinine, Ser: 0.8 mg/dL (ref 0.44–1.00)
GFR, Estimated: >60 mL/min (ref >60)
Glucose, Bld: 149 mg/dL — ABNORMAL HIGH (ref 70–99)
Potassium: 3.4 mmol/L — ABNORMAL LOW (ref 3.5–5.1)
Sodium: 134 mmol/L — ABNORMAL LOW (ref 135–145)
Total Bilirubin: 0.8 mg/dL (ref 0.0–1.2)
Total Protein: 7.8 g/dL (ref 6.5–8.1)

## 2024-03-23 LAB — TYPE AND SCREEN
ABO/RH(D): O POS
Antibody Screen: NEGATIVE

## 2024-03-23 LAB — PREGNANCY, URINE: Preg Test, Ur: NEGATIVE

## 2024-03-23 LAB — LIPASE, BLOOD: Lipase: 34 U/L (ref 11–51)

## 2024-03-23 MED ORDER — PANTOPRAZOLE SODIUM 40 MG IV SOLR
40.0000 mg | Freq: Once | INTRAVENOUS | Status: AC
  Administered 2024-03-23: 40 mg via INTRAVENOUS
  Filled 2024-03-23: qty 10

## 2024-03-23 MED ORDER — PANTOPRAZOLE SODIUM 40 MG PO TBEC: 40.0000 mg | DELAYED_RELEASE_TABLET | Freq: Every day | ORAL | 0 refills | Status: AC

## 2024-03-23 MED ORDER — IOHEXOL 350 MG/ML SOLN
100.0000 mL | Freq: Once | INTRAVENOUS | Status: AC | PRN
  Administered 2024-03-23: 100 mL via INTRAVENOUS

## 2024-03-23 NOTE — Discharge Instructions (Addendum)
 You were seen in the emergency department today for evaluation of your vomiting blood and vaginal bleeding.  Your vital signs and blood work here was overall reassuring.  Please avoid drinking alcohol in excess and do not take any NSAIDs such as ibuprofen , Advil , Motrin .  I sent a prescription for a medicine to protect your stomach lining called pantoprazole .  Please take this for the next few weeks.  Please arrange follow-up with your primary care doctor and GI for further evaluation of your symptoms.  You can also arrange follow-up with OB/GYN if you continue to have vaginal bleeding.    As we discussed, your appendix was slightly enlarged on your CT scan.  This can be an early sign of appendicitis.  We did discuss admitting you to the hospital to further observe you, but you did prefer to be discharged home.  It is very important that you return to the ER immediately if you develop any worsening pain in your right lower abdomen, fevers, recurrent vomiting, or any other new or concerning symptoms.

## 2024-03-23 NOTE — ED Provider Notes (Signed)
 [facility] Provider Note    Event Date/Time   First MD Initiated Contact with Patient 03/23/24 1253     (approximate)   History   Hematemesis   HPI  Kimberly Powers is a 28 year old female with history of daily alcohol use presenting to the emergency department for evaluation of hematemesis and vaginal bleeding.  Patient reports that today she had onset of nausea with 2 episodes of vomiting up bright red blood with clots.  Otherwise denies emesis.  Denies history of similar.  Reports drinking 3-4 strong drinks a night.  Additionally reports that she delivered 2 months ago and has had ongoing vaginal bleeding since that time.  Estimates changing a pad every couple of hours.  Has not spoken with her OB/GYN about this recently.  Does report some lower abdominal pain most notably in her left lower quadrant.     Physical Exam   Triage Vital Signs: ED Triage Vitals [03/23/24 1125]  Encounter Vitals Group     BP 131/84     Girls Systolic BP Percentile      Girls Diastolic BP Percentile      Boys Systolic BP Percentile      Boys Diastolic BP Percentile      Pulse Rate 67     Resp 16     Temp 98.3 F (36.8 C)     Temp Source Oral     SpO2 100 %     Weight 122 lb (55.3 kg)     Height 5' 3 (1.6 m)     Head Circumference      Peak Flow      Pain Score 10     Pain Loc      Pain Education      Exclude from Growth Chart     Most recent vital signs: Vitals:   03/23/24 1125 03/23/24 1542  BP: 131/84 125/82  Pulse: 67 63  Resp: 16 18  Temp: 98.3 F (36.8 C) 98.2 F (36.8 C)  SpO2: 100% 99%     General: Awake, interactive  HEENT: No visible blood in the oropharynx CV:  Regular rate, good peripheral perfusion.  Resp:  Unlabored respirations.  Abd:  Nondistended, soft, tender to palpation most notably in left lower quadrant, remainder of abdomen nontender Neuro:  Symmetric facial movement, fluid speech   ED Results / Procedures /  Treatments   Labs (all labs ordered are listed, but only abnormal results are displayed) Labs Reviewed  COMPREHENSIVE METABOLIC PANEL WITH GFR - Abnormal; Notable for the following components:      Result Value   Sodium 134 (*)    Potassium 3.4 (*)    CO2 19 (*)    Glucose, Bld 149 (*)    AST 42 (*)    All other components within normal limits  CBC WITH DIFFERENTIAL/PLATELET  PREGNANCY, URINE  LIPASE, BLOOD  POC OCCULT BLOOD, ED  TYPE AND SCREEN     EKG EKG independently reviewed and interpreted by myself demonstrates:    RADIOLOGY Imaging independently reviewed and interpreted by myself demonstrates: Pelvic ultrasound without acute abnormality, do note nonspecific cystic structure in the body of the uterus reviewed with OB/GYN as below CTA GI bleed protocol without acute bleed.  Enlarged appendix concerning for possible early appendicitis noted  Formal Radiology Read:  CT ANGIO GI BLEED Result Date: 03/23/2024 CLINICAL DATA:  Left lower quadrant pain and hematemesis. EXAM: CTA ABDOMEN AND PELVIS WITHOUT AND WITH CONTRAST TECHNIQUE: Multidetector  CT imaging of the abdomen and pelvis was performed using the standard protocol during bolus administration of intravenous contrast. Multiplanar reconstructed images and MIPs were obtained and reviewed to evaluate the vascular anatomy. RADIATION DOSE REDUCTION: This exam was performed according to the departmental dose-optimization program which includes automated exposure control, adjustment of the mA and/or kV according to patient size and/or use of iterative reconstruction technique. CONTRAST:  OMNIPAQUE  IOHEXOL  350 MG/ML SOLN COMPARISON:  CT abdomen and pelvis 06/05/2019 FINDINGS: VASCULAR Aorta: Normal caliber aorta without aneurysm, dissection, vasculitis or significant stenosis. Celiac: Patent without evidence of aneurysm, dissection, vasculitis or significant stenosis. SMA: Patent without evidence of aneurysm, dissection,  vasculitis or significant stenosis. Renals: Both renal arteries are patent without evidence of aneurysm, dissection, vasculitis, fibromuscular dysplasia or significant stenosis. IMA: Patent without evidence of aneurysm, dissection, vasculitis or significant stenosis. Inflow: Patent without evidence of aneurysm, dissection, vasculitis or significant stenosis. Proximal Outflow: Bilateral common femoral and visualized portions of the superficial and profunda femoral arteries are patent without evidence of aneurysm, dissection, vasculitis or significant stenosis. Veins: No obvious venous abnormality within the limitations of this arterial phase study. Review of the MIP images confirms the above findings. NON-VASCULAR Lower chest: No acute abnormality. Hepatobiliary: No focal liver abnormality is seen. No gallstones, gallbladder wall thickening, or biliary dilatation. Pancreas: Unremarkable. No pancreatic ductal dilatation or surrounding inflammatory changes. Spleen: Normal in size without focal abnormality. Adrenals/Urinary Tract: Adrenal glands are unremarkable. Kidneys are normal, without renal calculi, focal lesion, or hydronephrosis. Bladder is unremarkable. Stomach/Bowel: No active gastrointestinal bleeding identified. Appendix measures 8 mm in diameter. There is no surrounding inflammation. Otherwise, bowel loops appear within normal limits. There is no bowel obstruction, pneumatosis, free air or focal inflammation. Lymphatic: No significant vascular findings are present. No enlarged abdominal or pelvic lymph nodes. Reproductive: Uterus and bilateral adnexa are unremarkable. Other: There is a small fat containing umbilical hernia. There is also small fat containing supraumbilical midline ventral hernia. There is no ascites. Musculoskeletal: No fracture is seen. IMPRESSION: VASCULAR 1. No acute active gastrointestinal bleeding. 2. No acute vascular abnormality. NON-VASCULAR 1. The appendix is enlarged, but there is  no surrounding inflammation. Correlate clinically for early acute appendicitis Electronically Signed   By: Greig Pique M.D.   On: 03/23/2024 17:38   US  PELVIC TRANSABD W/PELVIC DOPPLER Result Date: 03/23/2024 CLINICAL DATA:  Left lower quadrant pain and vaginal bleeding. EXAM: TRANSABDOMINAL ULTRASOUND OF PELVIS DOPPLER ULTRASOUND OF OVARIES TECHNIQUE: Transabdominal ultrasound examination of the pelvis was performed including evaluation of the uterus, ovaries, adnexal regions, and pelvic cul-de-sac. Color and duplex Doppler ultrasound was utilized to evaluate blood flow to the ovaries. COMPARISON:  None Available. FINDINGS: Uterus Measurements: 8.0 x 3.5 x 5.5 cm = volume: 78.6 mL. Small cystic structure along the endometrial margin, within the body of the uterus, measures 8 mm and is nonspecific. Endometrium Thickness: 4 mm, within normal limits. No focal abnormality visualized. Right ovary Measurements: 1.9 x 1.1 x 1.5 cm = volume: 1.7 mL. Normal appearance/no adnexal mass. Left ovary Measurements: 2.5 x 0.9 x 1.3 cm = volume: 1.5 mL. Normal appearance/no adnexal mass. Pulsed Doppler evaluation demonstrates normal low-resistance arterial and venous waveforms in both ovaries. Other: No free fluid. IMPRESSION: No acute findings. Electronically Signed   By: Newell Eke M.D.   On: 03/23/2024 14:44    PROCEDURES:  Critical Care performed: No  Procedures   MEDICATIONS ORDERED IN ED: Medications  pantoprazole  (PROTONIX ) injection 40 mg (40 mg Intravenous Given 03/23/24  1503)  iohexol  (OMNIPAQUE ) 350 MG/ML injection 100 mL (100 mLs Intravenous Contrast Given 03/23/24 1716)     IMPRESSION / MDM / ASSESSMENT AND PLAN / ED COURSE  I reviewed the triage vital signs and the nursing notes.  Differential diagnosis includes, but is not limited to, alcoholic gastritis with hemorrhage, no history of cirrhosis or or liver disease, lower suspicion for variceal bleed, colitis, ovarian pathology, retained  products of conception  Patient's presentation is most consistent with acute presentation with potential threat to life or bodily function.  28 year old female presenting with 1 day of 2 episodes of hematemesis with lower abdominal pain, also reporting several months of ongoing vaginal bleeding.  Stable vitals on presentation.  Labs with reassuring hemoglobin at 13.3, CMP without critical derangements.  Normal lipase.  UPT negative.  History more suggestive of upper GI bleed, but with abdominal pain in the setting of bleeding, will obtain CT GI bleed protocol.  Will also obtain pelvic ultrasound to evaluate for ovarian pathology as well as retained products of conception given her ongoing vaginal bleeding.  Clinical Course as of 03/23/24 RONOLD Repress Mar 23, 2024  1621 Case reviewed with Dr. Leonce with OB/GYN team.  He notes that it is less likely that the area noted on ultrasound is reflective of retained products, but even if it is would be difficult to find in a D&C and could be further evaluated as an outpatient with her otherwise reassuring workup.  [NR]    Clinical Course User Index [NR] Levander Slate, MD   CTA GI bleed protocol without active GI bleed.  A borderline appendix noted.  Patient reassessed.  Had minimal tenderness in the right lower quadrant.  Her vital signs remained stable.  However, with her episodes of hematemesis as well as her equivocal appendix, I did recommend further evaluation as well as possible admission.  Patient reports she feels much improved, did not wish to stay for any further workup.  We did discuss possible repeat hemoglobin, surgical consultation, admission for monitoring.  Patient understands risks of worsening bleed, development of appendicitis, but does still prefer to be discharged home.  She does demonstrate decision-making capacity and is currently stable.  Strict return precautions were provided for recurrent hematemesis, worsening abdominal pain, specifically  if worsening in the right lower quadrant, or any other new or concerning symptoms.  Patient was discharged home with a prescription for PPI.  She was given information for follow-up with GI and OB/GYN.  FINAL CLINICAL IMPRESSION(S) / ED DIAGNOSES   Final diagnoses:  Hematemesis with nausea  Vaginal bleeding     Rx / DC Orders   ED Discharge Orders     None        Note:  This document was prepared using Dragon voice recognition software and may include unintentional dictation errors.   Levander Slate, MD 03/23/24 [phone]

## 2024-03-23 NOTE — ED Triage Notes (Addendum)
 Reports started vomiting blood this morning.  Admits to drink heavily 3-4 days a week.  Also complains of stomach pain. Also reports vaginal bleeding x 2 months since delivery of last child.
